# Patient Record
Sex: Male | Born: 1940 | Race: Black or African American | Hispanic: No | Smoking: Former smoker
Health system: Southern US, Community
[De-identification: ages and names within clinical notes are randomized; demographics above are authoritative.]

## PROBLEM LIST (undated history)

## (undated) DIAGNOSIS — K648 Other hemorrhoids: Secondary | ICD-10-CM

## (undated) DIAGNOSIS — F329 Major depressive disorder, single episode, unspecified: Secondary | ICD-10-CM

## (undated) DIAGNOSIS — R600 Localized edema: Secondary | ICD-10-CM

## (undated) DIAGNOSIS — C801 Malignant (primary) neoplasm, unspecified: Secondary | ICD-10-CM

## (undated) DIAGNOSIS — E119 Type 2 diabetes mellitus without complications: Secondary | ICD-10-CM

## (undated) DIAGNOSIS — F32A Depression, unspecified: Secondary | ICD-10-CM

## (undated) DIAGNOSIS — I10 Essential (primary) hypertension: Secondary | ICD-10-CM

## (undated) DIAGNOSIS — Z720 Tobacco use: Secondary | ICD-10-CM

## (undated) DIAGNOSIS — F1010 Alcohol abuse, uncomplicated: Secondary | ICD-10-CM

## (undated) DIAGNOSIS — M1990 Unspecified osteoarthritis, unspecified site: Secondary | ICD-10-CM

## (undated) DIAGNOSIS — Z9049 Acquired absence of other specified parts of digestive tract: Secondary | ICD-10-CM

## (undated) DIAGNOSIS — Z5900 Homelessness unspecified: Secondary | ICD-10-CM

## (undated) HISTORY — DX: Localized edema: R60.0

## (undated) HISTORY — DX: Depression, unspecified: F32.A

## (undated) HISTORY — DX: Essential (primary) hypertension: I10

## (undated) HISTORY — DX: Alcohol abuse, uncomplicated: F10.10

## (undated) HISTORY — DX: Type 2 diabetes mellitus without complications: E11.9

## (undated) HISTORY — DX: Other hemorrhoids: K64.8

## (undated) HISTORY — DX: Tobacco use: Z72.0

## (undated) HISTORY — DX: Unspecified osteoarthritis, unspecified site: M19.90

## (undated) HISTORY — DX: Major depressive disorder, single episode, unspecified: F32.9

## (undated) HISTORY — DX: Malignant (primary) neoplasm, unspecified: C80.1

## (undated) HISTORY — DX: Acquired absence of other specified parts of digestive tract: Z90.49

---
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
## 1968-10-07 HISTORY — PX: THORACOTOMY: SUR1349

## 1979-06-08 HISTORY — PX: OTHER SURGICAL HISTORY: SHX169

## 1999-03-18 ENCOUNTER — Encounter: Payer: Self-pay | Admitting: Emergency Medicine

## 1999-03-18 ENCOUNTER — Inpatient Hospital Stay (HOSPITAL_COMMUNITY): Admission: EM | Admit: 1999-03-18 | Discharge: 1999-03-19 | Payer: Self-pay | Admitting: Emergency Medicine

## 1999-03-20 ENCOUNTER — Encounter: Admission: RE | Admit: 1999-03-20 | Discharge: 1999-03-20 | Payer: Self-pay | Admitting: Internal Medicine

## 1999-05-28 ENCOUNTER — Encounter: Admission: RE | Admit: 1999-05-28 | Discharge: 1999-05-28 | Payer: Self-pay | Admitting: Internal Medicine

## 1999-07-02 ENCOUNTER — Encounter: Admission: RE | Admit: 1999-07-02 | Discharge: 1999-07-02 | Payer: Self-pay | Admitting: Hematology and Oncology

## 1999-07-30 ENCOUNTER — Encounter: Admission: RE | Admit: 1999-07-30 | Discharge: 1999-07-30 | Payer: Self-pay | Admitting: Internal Medicine

## 1999-09-10 ENCOUNTER — Encounter: Admission: RE | Admit: 1999-09-10 | Discharge: 1999-09-10 | Payer: Self-pay | Admitting: Internal Medicine

## 1999-10-08 HISTORY — PX: HEMICOLECTOMY: SHX854

## 1999-10-29 ENCOUNTER — Encounter: Admission: RE | Admit: 1999-10-29 | Discharge: 1999-10-29 | Payer: Self-pay | Admitting: Internal Medicine

## 2000-01-14 ENCOUNTER — Encounter: Admission: RE | Admit: 2000-01-14 | Discharge: 2000-01-14 | Payer: Self-pay | Admitting: Internal Medicine

## 2000-04-07 ENCOUNTER — Ambulatory Visit (HOSPITAL_COMMUNITY): Admission: RE | Admit: 2000-04-07 | Discharge: 2000-04-07 | Payer: Self-pay | Admitting: Internal Medicine

## 2000-04-07 ENCOUNTER — Encounter: Admission: RE | Admit: 2000-04-07 | Discharge: 2000-04-07 | Payer: Self-pay

## 2000-04-07 ENCOUNTER — Ambulatory Visit (HOSPITAL_COMMUNITY): Admission: RE | Admit: 2000-04-07 | Discharge: 2000-04-07 | Payer: Self-pay | Admitting: *Deleted

## 2000-05-12 ENCOUNTER — Encounter: Admission: RE | Admit: 2000-05-12 | Discharge: 2000-05-12 | Payer: Self-pay | Admitting: Internal Medicine

## 2000-05-13 ENCOUNTER — Encounter: Admission: RE | Admit: 2000-05-13 | Discharge: 2000-05-13 | Payer: Self-pay | Admitting: Internal Medicine

## 2000-05-15 ENCOUNTER — Encounter: Admission: RE | Admit: 2000-05-15 | Discharge: 2000-05-15 | Payer: Self-pay | Admitting: Hematology and Oncology

## 2000-06-26 ENCOUNTER — Encounter: Admission: RE | Admit: 2000-06-26 | Discharge: 2000-06-26 | Payer: Self-pay | Admitting: Hematology and Oncology

## 2000-07-11 ENCOUNTER — Encounter (INDEPENDENT_AMBULATORY_CARE_PROVIDER_SITE_OTHER): Payer: Self-pay | Admitting: Specialist

## 2000-07-11 ENCOUNTER — Other Ambulatory Visit: Admission: RE | Admit: 2000-07-11 | Discharge: 2000-07-11 | Payer: Self-pay | Admitting: Gastroenterology

## 2000-07-11 DIAGNOSIS — Z860100 Personal history of colon polyps, unspecified: Secondary | ICD-10-CM | POA: Insufficient documentation

## 2000-08-21 ENCOUNTER — Encounter: Payer: Self-pay | Admitting: Surgery

## 2000-08-22 ENCOUNTER — Encounter: Payer: Self-pay | Admitting: Surgery

## 2000-08-22 ENCOUNTER — Ambulatory Visit (HOSPITAL_COMMUNITY): Admission: RE | Admit: 2000-08-22 | Discharge: 2000-08-22 | Payer: Self-pay | Admitting: Surgery

## 2000-08-27 ENCOUNTER — Encounter (INDEPENDENT_AMBULATORY_CARE_PROVIDER_SITE_OTHER): Payer: Self-pay | Admitting: Specialist

## 2000-08-27 ENCOUNTER — Inpatient Hospital Stay (HOSPITAL_COMMUNITY): Admission: RE | Admit: 2000-08-27 | Discharge: 2000-09-16 | Payer: Self-pay | Admitting: Surgery

## 2000-08-27 DIAGNOSIS — Z9889 Other specified postprocedural states: Secondary | ICD-10-CM

## 2000-08-31 ENCOUNTER — Encounter: Payer: Self-pay | Admitting: General Surgery

## 2000-09-01 ENCOUNTER — Encounter: Payer: Self-pay | Admitting: Surgery

## 2000-09-02 ENCOUNTER — Encounter: Payer: Self-pay | Admitting: Surgery

## 2000-09-03 ENCOUNTER — Encounter: Payer: Self-pay | Admitting: Surgery

## 2000-09-08 ENCOUNTER — Encounter: Payer: Self-pay | Admitting: Family Medicine

## 2000-11-27 ENCOUNTER — Encounter: Admission: RE | Admit: 2000-11-27 | Discharge: 2000-11-27 | Payer: Self-pay | Admitting: Internal Medicine

## 2001-01-16 ENCOUNTER — Ambulatory Visit (HOSPITAL_COMMUNITY): Admission: RE | Admit: 2001-01-16 | Discharge: 2001-01-16 | Payer: Self-pay

## 2001-01-16 ENCOUNTER — Encounter: Admission: RE | Admit: 2001-01-16 | Discharge: 2001-01-16 | Payer: Self-pay

## 2002-04-12 ENCOUNTER — Encounter: Admission: RE | Admit: 2002-04-12 | Discharge: 2002-04-12 | Payer: Self-pay | Admitting: Internal Medicine

## 2002-08-25 ENCOUNTER — Ambulatory Visit (HOSPITAL_COMMUNITY): Admission: RE | Admit: 2002-08-25 | Discharge: 2002-08-25 | Payer: Self-pay | Admitting: Gastroenterology

## 2002-08-25 ENCOUNTER — Encounter (INDEPENDENT_AMBULATORY_CARE_PROVIDER_SITE_OTHER): Payer: Self-pay | Admitting: *Deleted

## 2002-12-20 ENCOUNTER — Encounter: Admission: RE | Admit: 2002-12-20 | Discharge: 2002-12-20 | Payer: Self-pay | Admitting: Internal Medicine

## 2002-12-21 ENCOUNTER — Emergency Department (HOSPITAL_COMMUNITY): Admission: EM | Admit: 2002-12-21 | Discharge: 2002-12-21 | Payer: Self-pay | Admitting: Emergency Medicine

## 2002-12-21 ENCOUNTER — Encounter: Payer: Self-pay | Admitting: Emergency Medicine

## 2003-02-15 ENCOUNTER — Encounter: Admission: RE | Admit: 2003-02-15 | Discharge: 2003-02-15 | Payer: Self-pay | Admitting: Internal Medicine

## 2003-03-21 ENCOUNTER — Encounter: Admission: RE | Admit: 2003-03-21 | Discharge: 2003-03-21 | Payer: Self-pay | Admitting: Internal Medicine

## 2003-05-07 ENCOUNTER — Encounter: Payer: Self-pay | Admitting: Emergency Medicine

## 2003-05-07 ENCOUNTER — Emergency Department (HOSPITAL_COMMUNITY): Admission: EM | Admit: 2003-05-07 | Discharge: 2003-05-07 | Payer: Self-pay | Admitting: Emergency Medicine

## 2003-05-09 ENCOUNTER — Encounter: Admission: RE | Admit: 2003-05-09 | Discharge: 2003-05-09 | Payer: Self-pay | Admitting: Internal Medicine

## 2003-06-23 ENCOUNTER — Encounter: Admission: RE | Admit: 2003-06-23 | Discharge: 2003-06-23 | Payer: Self-pay | Admitting: Internal Medicine

## 2003-07-08 ENCOUNTER — Encounter: Admission: RE | Admit: 2003-07-08 | Discharge: 2003-07-08 | Payer: Self-pay | Admitting: Internal Medicine

## 2003-07-28 ENCOUNTER — Encounter: Admission: RE | Admit: 2003-07-28 | Discharge: 2003-07-28 | Payer: Self-pay | Admitting: Internal Medicine

## 2003-08-19 ENCOUNTER — Encounter: Admission: RE | Admit: 2003-08-19 | Discharge: 2003-08-19 | Payer: Self-pay | Admitting: Internal Medicine

## 2003-08-22 ENCOUNTER — Ambulatory Visit (HOSPITAL_COMMUNITY): Admission: RE | Admit: 2003-08-22 | Discharge: 2003-08-22 | Payer: Self-pay | Admitting: Internal Medicine

## 2003-09-05 ENCOUNTER — Encounter: Admission: RE | Admit: 2003-09-05 | Discharge: 2003-09-05 | Payer: Self-pay | Admitting: Internal Medicine

## 2004-06-18 ENCOUNTER — Ambulatory Visit: Payer: Self-pay | Admitting: Internal Medicine

## 2004-07-12 ENCOUNTER — Ambulatory Visit: Payer: Self-pay | Admitting: Internal Medicine

## 2004-07-12 ENCOUNTER — Ambulatory Visit (HOSPITAL_COMMUNITY): Admission: RE | Admit: 2004-07-12 | Discharge: 2004-07-12 | Payer: Self-pay | Admitting: Internal Medicine

## 2004-07-16 ENCOUNTER — Ambulatory Visit: Payer: Self-pay | Admitting: Internal Medicine

## 2004-07-17 ENCOUNTER — Ambulatory Visit: Payer: Self-pay | Admitting: Internal Medicine

## 2004-07-24 ENCOUNTER — Ambulatory Visit: Payer: Self-pay | Admitting: Internal Medicine

## 2004-07-31 ENCOUNTER — Encounter: Admission: RE | Admit: 2004-07-31 | Discharge: 2004-07-31 | Payer: Self-pay | Admitting: Internal Medicine

## 2004-08-01 ENCOUNTER — Ambulatory Visit: Payer: Self-pay | Admitting: Internal Medicine

## 2004-08-16 ENCOUNTER — Emergency Department (HOSPITAL_COMMUNITY): Admission: EM | Admit: 2004-08-16 | Discharge: 2004-08-17 | Payer: Self-pay | Admitting: Emergency Medicine

## 2004-11-08 ENCOUNTER — Ambulatory Visit: Payer: Self-pay | Admitting: Internal Medicine

## 2004-11-16 ENCOUNTER — Encounter: Admission: RE | Admit: 2004-11-16 | Discharge: 2005-02-14 | Payer: Self-pay | Admitting: Internal Medicine

## 2005-01-25 ENCOUNTER — Inpatient Hospital Stay (HOSPITAL_COMMUNITY): Admission: EM | Admit: 2005-01-25 | Discharge: 2005-01-25 | Payer: Self-pay | Admitting: Emergency Medicine

## 2005-04-05 ENCOUNTER — Emergency Department (HOSPITAL_COMMUNITY): Admission: EM | Admit: 2005-04-05 | Discharge: 2005-04-05 | Payer: Self-pay | Admitting: Emergency Medicine

## 2005-06-27 ENCOUNTER — Ambulatory Visit: Payer: Self-pay | Admitting: Internal Medicine

## 2005-07-26 ENCOUNTER — Ambulatory Visit: Payer: Self-pay | Admitting: Internal Medicine

## 2005-09-27 ENCOUNTER — Emergency Department (HOSPITAL_COMMUNITY): Admission: EM | Admit: 2005-09-27 | Discharge: 2005-09-27 | Payer: Self-pay | Admitting: *Deleted

## 2005-12-23 ENCOUNTER — Ambulatory Visit: Payer: Self-pay | Admitting: Internal Medicine

## 2005-12-30 ENCOUNTER — Ambulatory Visit: Payer: Self-pay | Admitting: Internal Medicine

## 2006-01-27 ENCOUNTER — Ambulatory Visit: Payer: Self-pay | Admitting: Gastroenterology

## 2006-02-13 ENCOUNTER — Ambulatory Visit: Payer: Self-pay | Admitting: Gastroenterology

## 2006-04-22 ENCOUNTER — Ambulatory Visit: Payer: Self-pay | Admitting: Internal Medicine

## 2006-08-18 ENCOUNTER — Ambulatory Visit: Payer: Self-pay | Admitting: Internal Medicine

## 2006-08-18 LAB — CONVERTED CEMR LAB: Hgb A1c MFr Bld: 5.9 %

## 2006-08-20 ENCOUNTER — Encounter (INDEPENDENT_AMBULATORY_CARE_PROVIDER_SITE_OTHER): Payer: Self-pay | Admitting: Internal Medicine

## 2006-08-20 ENCOUNTER — Ambulatory Visit: Payer: Self-pay | Admitting: Hospitalist

## 2006-08-20 LAB — CONVERTED CEMR LAB
ALT: 8 units/L (ref 0–53)
AST: 11 units/L (ref 0–37)
Albumin: 4 g/dL (ref 3.5–5.2)
Alkaline Phosphatase: 83 units/L (ref 39–117)
BUN: 11 mg/dL (ref 6–23)
CO2: 32 meq/L (ref 19–32)
Calcium: 9.7 mg/dL (ref 8.4–10.5)
Chloride: 100 meq/L (ref 96–112)
Cholesterol: 137 mg/dL (ref 0–200)
Creatinine, Ser: 1.12 mg/dL (ref 0.40–1.50)
Glucose, Bld: 101 mg/dL — ABNORMAL HIGH (ref 70–99)
HCT: 42.1 % (ref 41.0–49.0)
HDL: 42 mg/dL (ref >39)
Hemoglobin: 14.6 g/dL (ref 13.9–16.8)
LDL Cholesterol: 68 mg/dL (ref 0–99)
MCHC: 34.7 g/dL (ref 33.1–35.4)
MCV: 87.2 fL (ref 78.8–100.0)
Platelets: 257 10*3/uL (ref 152–374)
Potassium: 4.5 meq/L (ref 3.5–5.3)
RBC: 4.83 M/uL (ref 4.20–5.50)
RDW: 13.3 % (ref 11.5–15.3)
Sodium: 140 meq/L (ref 135–145)
Total Bilirubin: 0.4 mg/dL (ref 0.3–1.2)
Total CHOL/HDL Ratio: 3.3
Total Protein: 7.2 g/dL (ref 6.0–8.3)
Triglycerides: 133 mg/dL (ref <150)
VLDL: 27 mg/dL (ref 0–40)
WBC: 5.2 10*3/uL (ref 3.7–10.0)

## 2006-08-25 ENCOUNTER — Ambulatory Visit: Payer: Self-pay | Admitting: Hospitalist

## 2006-09-01 ENCOUNTER — Encounter (INDEPENDENT_AMBULATORY_CARE_PROVIDER_SITE_OTHER): Payer: Self-pay | Admitting: Internal Medicine

## 2006-09-01 ENCOUNTER — Ambulatory Visit: Payer: Self-pay | Admitting: Hospitalist

## 2006-09-01 LAB — CONVERTED CEMR LAB
BUN: 18 mg/dL (ref 6–23)
CO2: 25 meq/L (ref 19–32)
Calcium: 9.4 mg/dL (ref 8.4–10.5)
Chloride: 100 meq/L (ref 96–112)
Creatinine, Ser: 1.21 mg/dL (ref 0.40–1.50)
Glucose, Bld: 87 mg/dL (ref 70–99)
Potassium: 4.1 meq/L (ref 3.5–5.3)
Sodium: 138 meq/L (ref 135–145)

## 2006-09-08 DIAGNOSIS — E119 Type 2 diabetes mellitus without complications: Secondary | ICD-10-CM

## 2006-09-08 DIAGNOSIS — I10 Essential (primary) hypertension: Secondary | ICD-10-CM | POA: Insufficient documentation

## 2006-09-09 DIAGNOSIS — F17200 Nicotine dependence, unspecified, uncomplicated: Secondary | ICD-10-CM

## 2006-09-09 DIAGNOSIS — F1010 Alcohol abuse, uncomplicated: Secondary | ICD-10-CM | POA: Insufficient documentation

## 2006-09-22 ENCOUNTER — Encounter (INDEPENDENT_AMBULATORY_CARE_PROVIDER_SITE_OTHER): Payer: Self-pay | Admitting: Internal Medicine

## 2006-09-22 ENCOUNTER — Ambulatory Visit: Payer: Self-pay | Admitting: Internal Medicine

## 2006-09-29 ENCOUNTER — Ambulatory Visit: Payer: Self-pay | Admitting: Internal Medicine

## 2006-09-29 ENCOUNTER — Encounter (INDEPENDENT_AMBULATORY_CARE_PROVIDER_SITE_OTHER): Payer: Self-pay | Admitting: Internal Medicine

## 2006-09-29 LAB — CONVERTED CEMR LAB
BUN: 13 mg/dL (ref 6–23)
CO2: 26 meq/L (ref 19–32)
Calcium: 9.6 mg/dL (ref 8.4–10.5)
Chloride: 101 meq/L (ref 96–112)
Creatinine, Ser: 1.18 mg/dL (ref 0.40–1.50)
Glucose, Bld: 88 mg/dL (ref 70–99)
Potassium: 4.9 meq/L (ref 3.5–5.3)
Sodium: 139 meq/L (ref 135–145)

## 2006-10-13 ENCOUNTER — Ambulatory Visit: Payer: Self-pay | Admitting: Hospitalist

## 2006-11-05 ENCOUNTER — Ambulatory Visit: Payer: Self-pay | Admitting: Hospitalist

## 2006-11-05 LAB — CONVERTED CEMR LAB: Blood Glucose, Fingerstick: 102

## 2006-11-27 ENCOUNTER — Ambulatory Visit: Payer: Self-pay | Admitting: Hospitalist

## 2006-11-27 ENCOUNTER — Encounter (INDEPENDENT_AMBULATORY_CARE_PROVIDER_SITE_OTHER): Payer: Self-pay | Admitting: Internal Medicine

## 2006-11-27 LAB — CONVERTED CEMR LAB
BUN: 15 mg/dL (ref 6–23)
CO2: 29 meq/L (ref 19–32)
Calcium: 9.5 mg/dL (ref 8.4–10.5)
Chloride: 100 meq/L (ref 96–112)
Creatinine, Ser: 1.4 mg/dL (ref 0.40–1.50)
Glucose, Bld: 94 mg/dL (ref 70–99)
Potassium: 3.9 meq/L (ref 3.5–5.3)
Sodium: 138 meq/L (ref 135–145)

## 2006-12-01 ENCOUNTER — Ambulatory Visit: Payer: Self-pay | Admitting: Hospitalist

## 2006-12-01 ENCOUNTER — Encounter (INDEPENDENT_AMBULATORY_CARE_PROVIDER_SITE_OTHER): Payer: Self-pay | Admitting: *Deleted

## 2006-12-01 LAB — CONVERTED CEMR LAB
BUN: 15 mg/dL (ref 6–23)
CO2: 31 meq/L (ref 19–32)
Calcium: 9.9 mg/dL (ref 8.4–10.5)
Chloride: 101 meq/L (ref 96–112)
Creatinine, Ser: 1.13 mg/dL (ref 0.40–1.50)
Glucose, Bld: 82 mg/dL
Glucose, Bld: 89 mg/dL (ref 70–99)
Hgb A1c MFr Bld: 5.6 %
Potassium: 4.5 meq/L (ref 3.5–5.3)
Sodium: 139 meq/L (ref 135–145)

## 2006-12-08 ENCOUNTER — Encounter (INDEPENDENT_AMBULATORY_CARE_PROVIDER_SITE_OTHER): Payer: Self-pay | Admitting: Internal Medicine

## 2007-04-02 ENCOUNTER — Encounter (INDEPENDENT_AMBULATORY_CARE_PROVIDER_SITE_OTHER): Payer: Self-pay | Admitting: Internal Medicine

## 2007-04-02 ENCOUNTER — Ambulatory Visit: Payer: Self-pay | Admitting: Internal Medicine

## 2007-04-02 LAB — CONVERTED CEMR LAB
ALT: <8 units/L (ref 0–53)
AST: 11 units/L (ref 0–37)
Albumin: 4.3 g/dL (ref 3.5–5.2)
Alkaline Phosphatase: 76 units/L (ref 39–117)
BUN: 10 mg/dL (ref 6–23)
Blood Glucose, Fingerstick: 96
CO2: 29 meq/L (ref 19–32)
Calcium: 9.9 mg/dL (ref 8.4–10.5)
Chloride: 99 meq/L (ref 96–112)
Cholesterol: 162 mg/dL (ref 0–200)
Creatinine, Ser: 1.09 mg/dL (ref 0.40–1.50)
Glucose, Bld: 96 mg/dL (ref 70–99)
HDL: 47 mg/dL (ref >39)
Hgb A1c MFr Bld: 5.5 %
LDL Cholesterol: 96 mg/dL (ref 0–99)
Potassium: 4.3 meq/L (ref 3.5–5.3)
Sodium: 138 meq/L (ref 135–145)
Total Bilirubin: 0.4 mg/dL (ref 0.3–1.2)
Total CHOL/HDL Ratio: 3.4
Total Protein: 7.4 g/dL (ref 6.0–8.3)
Triglycerides: 93 mg/dL (ref <150)
VLDL: 19 mg/dL (ref 0–40)

## 2007-08-27 ENCOUNTER — Ambulatory Visit: Payer: Self-pay | Admitting: Internal Medicine

## 2007-08-27 ENCOUNTER — Encounter (INDEPENDENT_AMBULATORY_CARE_PROVIDER_SITE_OTHER): Payer: Self-pay | Admitting: Internal Medicine

## 2007-08-27 LAB — CONVERTED CEMR LAB
Creatinine, Urine: 135.3 mg/dL
HCT: 43.7 % (ref 39.0–52.0)
Hemoglobin: 14.5 g/dL (ref 13.0–17.0)
MCHC: 33.2 g/dL (ref 30.0–36.0)
MCV: 89 fL (ref 78.0–100.0)
Microalb Creat Ratio: 3.5 mg/g (ref 0.0–30.0)
Microalb, Ur: 0.48 mg/dL (ref 0.00–1.89)
Platelets: 312 10*3/uL (ref 150–400)
RBC: 4.91 M/uL (ref 4.22–5.81)
RDW: 14.8 % (ref 11.5–15.5)
WBC: 6 10*3/uL (ref 4.0–10.5)

## 2007-09-10 ENCOUNTER — Telehealth (INDEPENDENT_AMBULATORY_CARE_PROVIDER_SITE_OTHER): Payer: Self-pay | Admitting: *Deleted

## 2007-09-10 ENCOUNTER — Encounter (INDEPENDENT_AMBULATORY_CARE_PROVIDER_SITE_OTHER): Payer: Self-pay | Admitting: Internal Medicine

## 2007-09-10 ENCOUNTER — Ambulatory Visit: Payer: Self-pay | Admitting: Internal Medicine

## 2007-09-10 LAB — CONVERTED CEMR LAB
Chloride: 99 meq/L (ref 96–112)
Creatinine, Ser: 1.05 mg/dL (ref 0.40–1.50)
Potassium: 4.8 meq/L (ref 3.5–5.3)
Sodium: 137 meq/L (ref 135–145)

## 2007-12-18 ENCOUNTER — Encounter: Payer: Self-pay | Admitting: Internal Medicine

## 2008-01-07 ENCOUNTER — Encounter (INDEPENDENT_AMBULATORY_CARE_PROVIDER_SITE_OTHER): Payer: Self-pay | Admitting: Internal Medicine

## 2008-01-07 ENCOUNTER — Ambulatory Visit: Payer: Self-pay | Admitting: Internal Medicine

## 2008-01-07 LAB — CONVERTED CEMR LAB
ALT: 8 units/L (ref 0–53)
Albumin: 4.2 g/dL (ref 3.5–5.2)
Blood Glucose, Fingerstick: 81
CO2: 29 meq/L (ref 19–32)
Microalb, Ur: 0.49 mg/dL (ref 0.00–1.89)
Potassium: 3.8 meq/L (ref 3.5–5.3)
Sodium: 139 meq/L (ref 135–145)
Total Bilirubin: 0.3 mg/dL (ref 0.3–1.2)
Total Protein: 7.5 g/dL (ref 6.0–8.3)

## 2008-02-25 ENCOUNTER — Ambulatory Visit: Payer: Self-pay | Admitting: Internal Medicine

## 2008-02-25 DIAGNOSIS — R609 Edema, unspecified: Secondary | ICD-10-CM | POA: Insufficient documentation

## 2008-03-10 ENCOUNTER — Ambulatory Visit: Payer: Self-pay | Admitting: *Deleted

## 2008-03-10 LAB — CONVERTED CEMR LAB: Blood Glucose, Fingerstick: 109

## 2008-04-04 ENCOUNTER — Telehealth (INDEPENDENT_AMBULATORY_CARE_PROVIDER_SITE_OTHER): Payer: Self-pay | Admitting: Internal Medicine

## 2008-06-10 ENCOUNTER — Telehealth (INDEPENDENT_AMBULATORY_CARE_PROVIDER_SITE_OTHER): Payer: Self-pay | Admitting: Internal Medicine

## 2009-04-12 ENCOUNTER — Telehealth: Payer: Self-pay | Admitting: *Deleted

## 2009-04-17 ENCOUNTER — Ambulatory Visit: Payer: Self-pay | Admitting: Infectious Diseases

## 2009-04-17 ENCOUNTER — Encounter: Payer: Self-pay | Admitting: Internal Medicine

## 2009-04-17 DIAGNOSIS — K0262 Dental caries on smooth surface penetrating into dentin: Secondary | ICD-10-CM | POA: Insufficient documentation

## 2009-04-17 LAB — CONVERTED CEMR LAB
HCT: 50.9 % (ref 39.0–52.0)
Hgb A1c MFr Bld: 5.9 %
LDL Cholesterol: 47 mg/dL (ref 0–99)
MCV: 89.6 fL (ref 78.0–100.0)
Potassium: 4.5 meq/L (ref 3.5–5.3)
RBC: 5.68 M/uL (ref 4.22–5.81)
Sodium: 139 meq/L (ref 135–145)
Total CHOL/HDL Ratio: 3.3
VLDL: 42 mg/dL — ABNORMAL HIGH (ref 0–40)
WBC: 6.8 10*3/uL (ref 4.0–10.5)

## 2009-04-26 ENCOUNTER — Ambulatory Visit: Payer: Self-pay | Admitting: Internal Medicine

## 2009-04-26 ENCOUNTER — Encounter: Payer: Self-pay | Admitting: Internal Medicine

## 2009-04-26 ENCOUNTER — Inpatient Hospital Stay (HOSPITAL_COMMUNITY): Admission: EM | Admit: 2009-04-26 | Discharge: 2009-04-28 | Payer: Self-pay | Admitting: Emergency Medicine

## 2009-04-27 ENCOUNTER — Encounter: Payer: Self-pay | Admitting: Internal Medicine

## 2009-04-28 ENCOUNTER — Encounter (INDEPENDENT_AMBULATORY_CARE_PROVIDER_SITE_OTHER): Payer: Self-pay | Admitting: Internal Medicine

## 2009-05-18 ENCOUNTER — Encounter: Payer: Self-pay | Admitting: Internal Medicine

## 2009-05-29 ENCOUNTER — Ambulatory Visit: Payer: Self-pay | Admitting: Internal Medicine

## 2009-06-16 ENCOUNTER — Ambulatory Visit: Payer: Self-pay | Admitting: Infectious Diseases

## 2009-06-16 LAB — CONVERTED CEMR LAB: Blood Glucose, Fingerstick: 101

## 2009-08-01 ENCOUNTER — Encounter: Payer: Self-pay | Admitting: Internal Medicine

## 2009-08-25 ENCOUNTER — Telehealth: Payer: Self-pay | Admitting: Internal Medicine

## 2009-08-28 ENCOUNTER — Telehealth: Payer: Self-pay | Admitting: Internal Medicine

## 2009-09-01 ENCOUNTER — Encounter: Payer: Self-pay | Admitting: Internal Medicine

## 2009-09-11 ENCOUNTER — Ambulatory Visit: Payer: Self-pay | Admitting: Internal Medicine

## 2009-09-11 LAB — CONVERTED CEMR LAB: Hgb A1c MFr Bld: 6.1 %

## 2009-10-24 ENCOUNTER — Encounter: Payer: Self-pay | Admitting: Internal Medicine

## 2010-01-17 ENCOUNTER — Encounter: Payer: Self-pay | Admitting: Internal Medicine

## 2010-02-08 ENCOUNTER — Ambulatory Visit: Payer: Self-pay | Admitting: Internal Medicine

## 2010-02-08 LAB — CONVERTED CEMR LAB
Blood Glucose, Fingerstick: 83
OCCULT 3: NEGATIVE

## 2010-02-08 LAB — FECAL OCCULT BLOOD, GUAIAC: Fecal Occult Blood: NEGATIVE

## 2010-07-05 ENCOUNTER — Ambulatory Visit: Payer: Self-pay | Admitting: Internal Medicine

## 2010-07-05 ENCOUNTER — Ambulatory Visit (HOSPITAL_COMMUNITY): Admission: RE | Admit: 2010-07-05 | Discharge: 2010-07-05 | Payer: Self-pay | Admitting: Internal Medicine

## 2010-07-05 DIAGNOSIS — G609 Hereditary and idiopathic neuropathy, unspecified: Secondary | ICD-10-CM | POA: Insufficient documentation

## 2010-07-05 DIAGNOSIS — M25569 Pain in unspecified knee: Secondary | ICD-10-CM

## 2010-07-05 LAB — CONVERTED CEMR LAB
Blood Glucose, Fingerstick: 136
Hgb A1c MFr Bld: 5.4 %

## 2010-07-24 ENCOUNTER — Encounter: Payer: Self-pay | Admitting: Internal Medicine

## 2010-07-30 ENCOUNTER — Encounter: Payer: Self-pay | Admitting: Internal Medicine

## 2010-09-10 ENCOUNTER — Telehealth: Payer: Self-pay | Admitting: Internal Medicine

## 2010-11-06 NOTE — Miscellaneous (Signed)
Summary: [facility]   Imported By: Florinda Marker 10/24/2009 14:05:18  _____________________________________________________________________  External Attachment:    Type:   Image     Comment:   External Document

## 2010-11-06 NOTE — Progress Notes (Signed)
Summary: Refill/gh  Phone Note Refill Request Message from:  Fax from Pharmacy on September 10, 2010 3:14 PM  Refills Requested: Medication #1:  METFORMIN HCL 500 MG TABS Take 1 tablet by mouth once a day.   Last Refilled: 06/25/2010  Method Requested: Fax to Local Pharmacy Initial call taken by: Angelina Ok RN,  September 10, 2010 3:14 PM  Follow-up for Phone Call       Follow-up by: Blondell Reveal MD,  September 10, 2010 5:50 PM    Prescriptions: METFORMIN HCL 500 MG TABS (METFORMIN HCL) Take 1 tablet by mouth once a day.  #30 x 11   Entered and Authorized by:   Blondell Reveal MD   Signed by:   Blondell Reveal MD on 09/10/2010   Method used:   Faxed to ...       [facility] (retail)       [address]       [place], [place]  [address]       Ph: [phone]       Fax: [phone]   RxID:   [id number]

## 2010-11-06 NOTE — Letter (Signed)
Summary: [facility]   Imported By: Shon Hough 08/01/2010 11:37:31  _____________________________________________________________________  External Attachment:    Type:   Image     Comment:   External Document

## 2010-11-06 NOTE — Assessment & Plan Note (Signed)
Summary: EST-3 MONTH RECHECK/CH   Vital Signs:  Patient profile:   70 year old male Height:      70 inches Weight:      182.0 pounds BMI:     26.21 Temp:     97.2 degrees F oral Pulse rate:   62 / minute BP sitting:   147 / 92  (right arm)  Vitals Entered By: Filomena Jungling NT II (July 05, 2010 10:14 AM) Is Patient Diabetic? Yes Did you bring your meter with you today? No Pain Assessment Patient in pain? yes     Location: BILATERAL LEG Intensity: 9 Type: aching Onset of pain  Chronic Nutritional Status BMI of 25 - 29 = overweight CBG Result 136  Have you ever been in a relationship where you felt threatened, hurt or afraid?No   Does patient need assistance? Functional Status Self care Ambulation Normal   Primary Care Provider:  Blondell Reveal MD   History of Present Illness: 70 year old AAM with PMH as mentioned in the EMR comes to the office for a regular follow up visit.  1. Patient reports that he has been having bilateral knee pains for the last few weeks. He reports that the  pains he hears pops occasionally when he is walking.   2. Patient reports that burning pains for the last few months. Constant pains, aggravated by walking.   He denies any other complaints.   Preventive Screening-Counseling & Management  Alcohol-Tobacco     Alcohol drinks/day: 0     Smoking Status: quit < 6 months     Smoking Cessation Counseling: no     Packs/Day: <0.25     Year Started: AT THE AGE 75'S     Year Quit: 2004  Caffeine-Diet-Exercise     Caffeine use/day: 1can of coca-cola /month     Does Patient Exercise: no  Problems Prior to Update: 1)  Dental Caries Extending Into [place]  (ICD-521.02) 2)  Pedal Edema  (ICD-782.3) 3)  Health Screening  (ICD-V70.0) 4)  Hypertension  (ICD-401.9) 5)  Diabetes Mellitus, Type II  (ICD-250.00) 6)  Tubulovillous Adenoma, Colon, Hx of  (ICD-V12.72) 7)  Colectomy, Partial, With Anastomosis, Hx of  (ICD-V15.2) 8)  Abuse, Alcohol,  Unspecified  (ICD-305.00) 9)  Tobacco Abuse  (ICD-305.1)  Medications Prior to Update: 1)  Lisinopril 40 Mg Tabs (Lisinopril) .... Take 1 Tablet By Mouth Once A Day 2)  Metformin Hcl 500 Mg Tabs (Metformin Hcl) .... Take 1 Tablet By Mouth Once A Day. 3)  Aspir-Low 81 Mg Tbec (Aspirin) .... Take 1 Tablet By Mouth Once A Day With Meals 4)  Hydrochlorothiazide 25 Mg Tabs (Hydrochlorothiazide) .... Take 1 Tablet By Mouth Once A Day 5)  Carvedilol 3.125 Mg Tabs (Carvedilol) .... Take 1 Tablet By Mouth Two Times A Day  Current Medications (verified): 1)  Lisinopril 40 Mg Tabs (Lisinopril) .... Take 1 Tablet By Mouth Once A Day 2)  Metformin Hcl 500 Mg Tabs (Metformin Hcl) .... Take 1 Tablet By Mouth Once A Day. 3)  Aspir-Low 81 Mg Tbec (Aspirin) .... Take 1 Tablet By Mouth Once A Day With Meals 4)  Hydrochlorothiazide 25 Mg Tabs (Hydrochlorothiazide) .... Take 1 Tablet By Mouth Once A Day 5)  Carvedilol 3.125 Mg Tabs (Carvedilol) .... Take 1 Tablet By Mouth Two Times A Day  Allergies (verified): No Known Drug Allergies  Social History: Smoking Status:  quit < 6 months  Review of Systems      See HPI  Physical Exam  General:  alert, well-developed, and well-nourished.   Head:  normocephalic and atraumatic.   Nose:  no external deformity.   Neck:  supple and full ROM.   Lungs:  normal respiratory effort, no intercostal retractions, no accessory muscle use, and normal breath sounds.   Heart:  normal rate, regular rhythm, no murmur, and no gallop.     Impression & Recommendations:  Problem # 1:  HYPERTENSION (ICD-401.9) Repeat manual BP is132/74. Continue current regimen.  His updated medication list for this problem includes:    Lisinopril 40 Mg Tabs (Lisinopril) .Marland Kitchen... Take 1 tablet by mouth once a day    Hydrochlorothiazide 25 Mg Tabs (Hydrochlorothiazide) .Marland Kitchen... Take 1 tablet by mouth once a day    Carvedilol 3.125 Mg Tabs (Carvedilol) .Marland Kitchen... Take 1 tablet by mouth two times a  day  BP today: 147/92 Prior BP: 179/115 (02/08/2010)  Prior 10 Yr Risk Heart Disease: 11 % (04/17/2009)  Labs Reviewed: K+: 4.5 (04/17/2009) Creat: : 1.19 (04/17/2009)   Chol: 127 (04/17/2009)   HDL: 38 (04/17/2009)   LDL: 47 (04/17/2009)   TG: 210 (04/17/2009)  Problem # 2:  DIABETES MELLITUS, TYPE II (ICD-250.00) Greatly controlled. Continue current strength of metformin. His updated medication list for this problem includes:    Lisinopril 40 Mg Tabs (Lisinopril) .Marland Kitchen... Take 1 tablet by mouth once a day    Metformin Hcl 500 Mg Tabs (Metformin hcl) .Marland Kitchen... Take 1 tablet by mouth once a day.    Aspir-low 81 Mg Tbec (Aspirin) .Marland Kitchen... Take 1 tablet by mouth once a day with meals  Orders: T-Hgb A1C (in-house) (19147WG) T- Capillary Blood Glucose (95621)  Labs Reviewed: Creat: 1.19 (04/17/2009)     Last Eye Exam: Mild non-proliferative diabetic retinopathy.  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 Will start Gabapentin. Will refer to Podiatry for annual check.  Orders: Podiatry Referral (Podiatry)  Problem # 4:  KNEE PAIN, BILATERAL (ICD-719.46)  Suspect secondary to OA.Will check X rays of both knees.Will follow up. Recommended him to takeTylenol arthritis.   His updated medication list for this problem includes:    Aspir-low 81 Mg Tbec (Aspirin) .Marland Kitchen... Take 1 tablet by mouth once a day with meals  Orders: T-DG Knee 1-2 Views Bilat (30865)  Complete Medication List: 1)  Lisinopril 40 Mg Tabs (Lisinopril) .... Take 1 tablet by mouth once a day 2)  Metformin Hcl 500 Mg Tabs (Metformin hcl) .... Take 1 tablet by mouth once a day. 3)  Aspir-low 81 Mg Tbec (Aspirin) .... Take 1 tablet by mouth once a day with meals 4)  Hydrochlorothiazide 25 Mg Tabs (Hydrochlorothiazide) .... Take 1 tablet by mouth once a day 5)  Carvedilol 3.125 Mg Tabs (Carvedilol) ....  Take 1 tablet by mouth two times a day 6)  Gabapentin 300 Mg Caps (Gabapentin) .... Take 1 tablet by mouth three times a day  Patient Instructions: 1)  Please schedule a follow-up appointment in 3 months. 2)  Take all the medications as advised below. Prescriptions: GABAPENTIN 300 MG CAPS (GABAPENTIN) Take 1 tablet by mouth three times a day  #90 x 5   Entered and Authorized by:   Blondell Reveal MD   Signed by:   Blondell Reveal MD on 07/05/2010   Method used:   Faxed to ...       [facility] (retail)       [id number]       [place], [place]  [address]       Ph: [phone]       Fax: [phone]   RxID:   [id number]   Prevention & Chronic Care Immunizations   Influenza vaccine: Fluvax Non-MCR  (08/27/2007)   Influenza vaccine deferral: Refused  (09/11/2009)    Tetanus booster: 04/17/2009: Tdap   Tetanus booster due: 04/18/2019    Pneumococcal vaccine: Pneumovax  (04/17/2009)   Pneumococcal vaccine due: 04/17/2014    H. zoster vaccine: Not documented  Colorectal Screening   Hemoccult: Not documented   Hemoccult action/deferral: Deferred  (09/11/2009)    Colonoscopy: Normal  (02/13/2006)  Other Screening   PSA: Not documented   Smoking status: quit < 6 months  (07/05/2010)  Diabetes Mellitus   HgbA1C: 5.4  (07/05/2010)   HgbA1C action/deferral: Ordered  (07/05/2010)    Eye exam: Mild non-proliferative diabetic retinopathy.  OU   (05/18/2009)   Eye exam due: 05/2010    Foot exam: yes  (04/17/2009)   Foot exam action/deferral: Do today   High risk foot: Yes  (04/17/2009)   Foot care education: Done  (04/17/2009)   Foot exam due: 04/17/2010    Urine microalbumin/creatinine ratio: 9.3  (09/11/2009)   Urine microalbumin action/deferral: Ordered  Lipids   Total Cholesterol: 127  (04/17/2009)   LDL: 47  (04/17/2009)   LDL Direct: Not documented   HDL: 38  (04/17/2009)   Triglycerides: 210  (04/17/2009)  Hypertension    Last Blood Pressure: 147 / 92  (07/05/2010)   Serum creatinine: 1.19  (04/17/2009)   Serum potassium 4.5  (04/17/2009)  Self-Management Support :   Personal Goals (by the next clinic visit) :     Personal A1C goal: 7  (09/11/2009)     Personal blood pressure goal: 130/80  (09/11/2009)     Personal LDL goal: 100  (09/11/2009)    Patient will work on the following items until the next clinic visit to reach self-care goals:     Medications and monitoring: take my medicines every day, check my blood sugar  (07/05/2010)     Eating: drink diet soda or water instead of juice or soda, eat more vegetables, use fresh or frozen vegetables, eat foods that are low in salt, eat baked foods instead of fried foods, eat fruit for snacks and desserts  (07/05/2010)     Activity: take a 30 minute walk every day  (02/08/2010)     Other: walks around house  (09/11/2009)    Diabetes self-management support: Education handout, Resources for patients handout  (07/05/2010)   Diabetes education handout printed    Hypertension self-management support: Education handout, Resources for patients handout  (07/05/2010)   Hypertension education handout printed      Resource handout printed.   Nursing Instructions: HgbA1C today (see order)      Laboratory Results   Blood Tests   Date/Time Received: July 05, 2010 11:01 AM  Date/Time Reported: Burke Keels  July 05, 2010 11:01 AM   HGBA1C: 5.4%   (Normal Range: Non-Diabetic - 3-6%   Control Diabetic - 6-8%) CBG Random:: 136mg /dL

## 2010-11-06 NOTE — Consult Note (Signed)
Summary: [facility]   Imported By: Louretta Parma 07/31/2010 16:48:23  _____________________________________________________________________  External Attachment:    Type:   Image     Comment:   External Document

## 2010-11-06 NOTE — Letter (Signed)
[facility]   Imported By: Margie Billet 01/18/2010 11:46:28  _____________________________________________________________________  External Attachment:    Type:   Image     Comment:   External Document
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
## 2010-11-30 ENCOUNTER — Encounter: Payer: Self-pay | Admitting: Ophthalmology

## 2010-11-30 ENCOUNTER — Ambulatory Visit: Payer: Self-pay | Admitting: Internal Medicine

## 2010-11-30 ENCOUNTER — Ambulatory Visit (INDEPENDENT_AMBULATORY_CARE_PROVIDER_SITE_OTHER): Payer: Medicare Other | Admitting: Ophthalmology

## 2010-11-30 DIAGNOSIS — R32 Unspecified urinary incontinence: Secondary | ICD-10-CM

## 2010-11-30 DIAGNOSIS — I10 Essential (primary) hypertension: Secondary | ICD-10-CM

## 2010-11-30 DIAGNOSIS — D369 Benign neoplasm, unspecified site: Secondary | ICD-10-CM | POA: Insufficient documentation

## 2010-11-30 DIAGNOSIS — Z23 Encounter for immunization: Secondary | ICD-10-CM

## 2010-11-30 DIAGNOSIS — Z0000 Encounter for general adult medical examination without abnormal findings: Secondary | ICD-10-CM

## 2010-11-30 DIAGNOSIS — N400 Enlarged prostate without lower urinary tract symptoms: Secondary | ICD-10-CM

## 2010-11-30 DIAGNOSIS — E119 Type 2 diabetes mellitus without complications: Secondary | ICD-10-CM

## 2010-11-30 DIAGNOSIS — Z86010 Personal history of colonic polyps: Secondary | ICD-10-CM

## 2010-11-30 DIAGNOSIS — M25569 Pain in unspecified knee: Secondary | ICD-10-CM

## 2010-11-30 LAB — BASIC METABOLIC PANEL
BUN: 12 mg/dL (ref 6–23)
CO2: 28 mEq/L (ref 19–32)
Chloride: 98 mEq/L (ref 96–112)
Creat: 1.04 mg/dL (ref 0.40–1.50)
Glucose, Bld: 153 mg/dL — ABNORMAL HIGH (ref 70–99)
Potassium: 3.6 mEq/L (ref 3.5–5.3)

## 2010-11-30 MED ORDER — GABAPENTIN 300 MG PO CAPS: 300.0000 mg | ORAL_CAPSULE | Freq: Three times a day (TID) | ORAL | Status: DC

## 2010-11-30 MED ORDER — CARVEDILOL 3.125 MG PO TABS: 3.1250 mg | ORAL_TABLET | Freq: Two times a day (BID) | ORAL | Status: DC

## 2010-11-30 MED ORDER — LISINOPRIL 40 MG PO TABS: 40.0000 mg | ORAL_TABLET | Freq: Every day | ORAL | Status: DC

## 2010-11-30 MED ORDER — FINASTERIDE 5 MG PO TABS: 5.0000 mg | ORAL_TABLET | Freq: Every day | ORAL | Status: AC

## 2010-11-30 MED ORDER — METFORMIN HCL 500 MG PO TABS: 500.0000 mg | ORAL_TABLET | Freq: Every day | ORAL | Status: DC

## 2010-11-30 MED ORDER — CARVEDILOL 6.25 MG PO TABS: 6.2500 mg | ORAL_TABLET | Freq: Two times a day (BID) | ORAL | Status: DC

## 2010-11-30 MED ORDER — HYDROCHLOROTHIAZIDE 25 MG PO TABS: 25.0000 mg | ORAL_TABLET | Freq: Every day | ORAL | Status: DC

## 2010-11-30 MED ORDER — ASPIRIN 81 MG PO TBEC: 81.0000 mg | DELAYED_RELEASE_TABLET | Freq: Every day | ORAL | Status: DC

## 2010-11-30 NOTE — Assessment & Plan Note (Signed)
The patient will receive a flu shot today.

## 2010-11-30 NOTE — Progress Notes (Signed)
Subjective:    Patient ID: Caleb Andrews, male    DOB: 07/22/1941, 70 y.o.   MRN: [id number]  HPI This is a 70 year old male with a past medical history significant for Type II DM and HTN who presents for routine check up.  The patient was last seen by Dr. Comer Locket and at that time had burning pains in his legs as well as knee pain.  This was felt to be consistent with his peripheral neuropathy and neuronin was started.  Bilateral knee x-rays demonstrated mild to moderate osteoarthritis of the right knee with old MCL tear of the left. Pt states that his knee pains are improving. and currently denies significant problems with his peripheral neuropathy.   Pt also presents with a 3-4 month hx of incontinance.  The patient endorses increased urgency but denies any burning when he urinates, back pain, hematuria. The episodes occur about once weekly.  No bowl incontinence.  Pt does feel that he has an occasional weak stream but denies any difficulty with initation.    With regards to pts DM pt has not been checking his CBG's, but  Pt denies any hypoglycemia and wishes to be refered to Lupita Leash for assistance with his glucometer use.  Review of Systems  Constitutional: Negative for fever and chills.  Respiratory: Negative for cough and shortness of breath.   Cardiovascular: Negative for chest pain and palpitations.  Gastrointestinal: Negative for vomiting, diarrhea and constipation.  Genitourinary: Negative for dysuria, discharge and enuresis.       Objective:   Physical Exam  Constitutional: He appears well-developed and well-nourished.  HENT:  Head: Normocephalic and atraumatic.  Eyes: Pupils are equal, round, and reactive to light.  Cardiovascular: Normal rate, regular rhythm and intact distal pulses.  Exam reveals no gallop and no friction rub.   No murmur heard. Pulmonary/Chest: Effort normal and breath sounds normal. He has no wheezes. He has no rales.  Abdominal: Soft. Bowel sounds  are normal. He exhibits no distension. There is no tenderness.  Genitourinary:       There is a mildly enlarge prostate gland without any nodularity.  There is normal rectal tone.   Musculoskeletal: Normal range of motion.  Neurological: He is alert. No cranial nerve deficit.  Skin: No rash noted.        Current outpatient prescriptions:aspirin 81 MG EC tablet, Take 1 tablet (81 mg total) by mouth daily., Disp: 30 tablet, Rfl: 11;  carvedilol (COREG) 6.25 MG tablet, Take 1 tablet (6.25 mg total) by mouth 2 (two) times daily with a meal., Disp: 60 tablet, Rfl: 11;  finasteride (PROSCAR) 5 MG tablet, Take 1 tablet (5 mg total) by mouth daily., Disp: 30 tablet, Rfl: 11 gabapentin (NEURONTIN) 300 MG capsule, Take 1 capsule (300 mg total) by mouth 3 (three) times daily., Disp: 90 capsule, Rfl: 11;  hydrochlorothiazide 25 MG tablet, Take 1 tablet (25 mg total) by mouth daily., Disp: 30 tablet, Rfl: 11;  lisinopril (PRINIVIL,ZESTRIL) 40 MG tablet, Take 1 tablet (40 mg total) by mouth daily., Disp: 30 tablet, Rfl: 11 metFORMIN (GLUCOPHAGE) 500 MG tablet, Take 1 tablet (500 mg total) by mouth daily with breakfast., Disp: 30 tablet, Rfl: 11;  DISCONTD: aspirin 81 MG EC tablet, Take 81 mg by mouth daily.  , Disp: , Rfl: ;  DISCONTD: carvedilol (COREG) 3.125 MG tablet, Take 6.25 mg by mouth 2 (two) times daily with a meal. , Disp: , Rfl:  DISCONTD: carvedilol (COREG) 3.125 MG tablet, Take 1  tablet (3.125 mg total) by mouth 2 (two) times daily with a meal., Disp: 60 tablet, Rfl: 11;  DISCONTD: gabapentin (NEURONTIN) 300 MG capsule, Take 300 mg by mouth 3 (three) times daily.  , Disp: , Rfl: ;  DISCONTD: hydrochlorothiazide 25 MG tablet, Take 25 mg by mouth daily.  , Disp: , Rfl: ;  DISCONTD: lisinopril (PRINIVIL,ZESTRIL) 40 MG tablet, Take 40 mg by mouth daily.  , Disp: , Rfl:  DISCONTD: metFORMIN (GLUCOPHAGE) 500 MG tablet, Take 500 mg by mouth daily with breakfast.  , Disp: , Rfl:   Past Medical History    Diagnosis Date  . Type II diabetes mellitus   . Hypertension       Assessment & Plan:

## 2010-11-30 NOTE — Assessment & Plan Note (Signed)
Will start fenasteride today, if bp continues to be elevated consider changing to an alpha 1 inhibitor.

## 2010-11-30 NOTE — Patient Instructions (Signed)
Please return in 2-3 weeks for a repeat blood pressure check.  If you develop any new symptoms in the mean time, please call.  We will schedule you for both a colonoscopy as well as a eye exam.

## 2010-11-30 NOTE — Assessment & Plan Note (Addendum)
This is likely secondary to BPH though the DDX would also include UTI, spinal cord injury (very unlikely). Will check a UA and culture today as well as get a BMET to check his renal function.  Will treat at this point in time with finasteride.  If pt continues to have symptoms consider a post void residual to rule out overflow incontinence.

## 2010-11-30 NOTE — Assessment & Plan Note (Signed)
This is out of control and the patient states that he is taking his medications regularly.   I will increase the pts coreg today and follow up in 2-3 weeks for a BP check.

## 2010-11-30 NOTE — Assessment & Plan Note (Addendum)
At this point, the patients diabetes is under good control and he has had an A1C <7.  I will make no adjustments in the patients medications today.   I did  Encourage the patient to continue regularly checking their post prandial CBG's, as well as to continue checking their feet for lesions daily.  I also recommended that the patient continue to watch their diet and avoid high glycemic index foods.  The patient denies any barriers to these recommendations.  The pt does wish to meet with Ahmed Prima for DM education. Will also refer for ophthalmology examination.   Lab Results  Component Value Date   HGBA1C 6.3 11/30/2010   HGBA1C 5.4 07/05/2010   HGBA1C 6.0 02/08/2010   Lab Results  Component Value Date   MICROALBUR 0.50 09/11/2009   CREATININE 1.19 04/17/2009

## 2010-11-30 NOTE — Assessment & Plan Note (Signed)
This has improved and only occasionally bothers the patient.  Will continue to monitor at this time.

## 2010-11-30 NOTE — Assessment & Plan Note (Signed)
I will refer the patient for repeat colonoscopy today. It appears that the pt has not had one since 2003.

## 2010-12-01 LAB — URINALYSIS, ROUTINE W REFLEX MICROSCOPIC
Bilirubin Urine: NEGATIVE
Hgb urine dipstick: NEGATIVE
Ketones, ur: NEGATIVE mg/dL
Specific Gravity, Urine: 1.016 (ref 1.005–1.030)
Urine Glucose, Fasting: NEGATIVE mg/dL
pH: 6 (ref 5.0–8.0)

## 2010-12-04 ENCOUNTER — Other Ambulatory Visit: Payer: Self-pay | Admitting: *Deleted

## 2010-12-04 DIAGNOSIS — I10 Essential (primary) hypertension: Secondary | ICD-10-CM

## 2010-12-06 MED ORDER — HYDROCHLOROTHIAZIDE 25 MG PO TABS: 25.0000 mg | ORAL_TABLET | Freq: Every day | ORAL | Status: DC

## 2010-12-06 MED ORDER — LISINOPRIL 40 MG PO TABS: 40.0000 mg | ORAL_TABLET | Freq: Every day | ORAL | Status: DC

## 2010-12-06 MED ORDER — GABAPENTIN 300 MG PO CAPS: 300.0000 mg | ORAL_CAPSULE | Freq: Three times a day (TID) | ORAL | Status: DC

## 2010-12-17 ENCOUNTER — Encounter: Payer: Self-pay | Admitting: Ophthalmology

## 2010-12-20 ENCOUNTER — Ambulatory Visit: Payer: Medicare Other | Admitting: Dietician

## 2010-12-20 ENCOUNTER — Ambulatory Visit: Payer: Medicare Other | Admitting: Internal Medicine

## 2010-12-20 LAB — GLUCOSE, CAPILLARY: Glucose-Capillary: 136 mg/dL — ABNORMAL HIGH (ref 70–99)

## 2011-01-08 LAB — GLUCOSE, CAPILLARY: Glucose-Capillary: 125 mg/dL — ABNORMAL HIGH (ref 70–99)

## 2011-01-11 LAB — GLUCOSE, CAPILLARY: Glucose-Capillary: 101 mg/dL — ABNORMAL HIGH (ref 70–99)

## 2011-01-13 LAB — GLUCOSE, CAPILLARY
Glucose-Capillary: 182 mg/dL — ABNORMAL HIGH (ref 70–99)
Glucose-Capillary: 69 mg/dL — ABNORMAL LOW (ref 70–99)
Glucose-Capillary: 88 mg/dL (ref 70–99)
Glucose-Capillary: 95 mg/dL (ref 70–99)
Glucose-Capillary: 96 mg/dL (ref 70–99)
Glucose-Capillary: 81 mg/dL (ref 70–99)

## 2011-01-13 LAB — PROTIME-INR: INR: 1 (ref 0.00–1.49)

## 2011-01-13 LAB — CBC
HCT: 45.9 % (ref 39.0–52.0)
MCHC: 34.3 g/dL (ref 30.0–36.0)
Platelets: 208 10*3/uL (ref 150–400)
RDW: 13.5 % (ref 11.5–15.5)

## 2011-01-13 LAB — CARDIAC PANEL(CRET KIN+CKTOT+MB+TROPI)
Relative Index: 0.8 (ref 0.0–2.5)
Troponin I: 0.02 ng/mL (ref 0.00–0.06)

## 2011-01-13 LAB — MAGNESIUM: Magnesium: 1.9 mg/dL (ref 1.5–2.5)

## 2011-01-13 LAB — BASIC METABOLIC PANEL
BUN: 12 mg/dL (ref 6–23)
GFR calc non Af Amer: 57 mL/min — ABNORMAL LOW (ref >60)
Glucose, Bld: 125 mg/dL — ABNORMAL HIGH (ref 70–99)
Potassium: 4 mEq/L (ref 3.5–5.1)

## 2011-01-13 LAB — HIV ANTIBODY (ROUTINE TESTING W REFLEX): HIV: NONREACTIVE

## 2011-01-13 LAB — URINALYSIS, ROUTINE W REFLEX MICROSCOPIC
Nitrite: NEGATIVE
Specific Gravity, Urine: 1.015 (ref 1.005–1.030)
Urobilinogen, UA: 0.2 mg/dL (ref 0.0–1.0)

## 2011-01-13 LAB — POCT CARDIAC MARKERS: CKMB, poc: <1 ng/mL — ABNORMAL LOW (ref 1.0–8.0)

## 2011-01-13 LAB — TROPONIN I: Troponin I: 0.02 ng/mL (ref 0.00–0.06)

## 2011-01-13 LAB — RAPID URINE DRUG SCREEN, HOSP PERFORMED
Barbiturates: NOT DETECTED
Opiates: NOT DETECTED

## 2011-01-13 LAB — TSH: TSH: 1.739 u[IU]/mL (ref 0.350–4.500)

## 2011-01-13 LAB — CK TOTAL AND CKMB (NOT AT ARMC): Relative Index: 1.6 (ref 0.0–2.5)

## 2011-01-13 LAB — DIFFERENTIAL: Lymphocytes Relative: 13 % (ref 12–46)

## 2011-01-13 LAB — APTT: aPTT: 25 seconds (ref 24–37)

## 2011-01-23 ENCOUNTER — Encounter: Payer: Self-pay | Admitting: *Deleted

## 2011-01-23 ENCOUNTER — Telehealth: Payer: Self-pay | Admitting: *Deleted

## 2011-01-23 NOTE — Telephone Encounter (Signed)
Pt came in today for Ophthal. And GI referral appts ; which were already scheduled.  I re-scheduled appts and sent tp pt via mail.

## 2011-02-15 ENCOUNTER — Other Ambulatory Visit: Payer: Medicare Other | Admitting: Gastroenterology

## 2011-02-19 ENCOUNTER — Encounter: Payer: Self-pay | Admitting: Dietician

## 2011-02-19 ENCOUNTER — Ambulatory Visit (INDEPENDENT_AMBULATORY_CARE_PROVIDER_SITE_OTHER): Payer: Medicare Other | Admitting: Dietician

## 2011-02-19 ENCOUNTER — Encounter: Payer: Medicare Other | Admitting: Internal Medicine

## 2011-02-19 DIAGNOSIS — E119 Type 2 diabetes mellitus without complications: Secondary | ICD-10-CM

## 2011-02-19 LAB — GLUCOSE, CAPILLARY: Glucose-Capillary: 133 mg/dL — ABNORMAL HIGH (ref 70–99)

## 2011-02-19 NOTE — Patient Instructions (Signed)
Please schedule a visit to learn how to use new meter same day as next doctor appointtnment.  Should be in the morning so you can have fasting cholesterol done.
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
## 2011-02-19 NOTE — Progress Notes (Signed)
Medical Nutrition Therapy:  Appt start time 1500 end time:  1530.  Assessment:  Primary concerns today: Blood sugar control awaternd help at home, nutritional status assessment. Usual eating pattern includes Meal 3 and 3+ snacks per day.  Bedtime 8-9 Pm and  Arises 4 am- watches TV, [place] bacon eggs x 2, cheese, toast x 2 with margarine, milk,  Lunch- stew from can with additions- corn, black eyed peas, onions and crackers. dinner about 5 PM stew again, fruit for snacks every other day, small amounts of juice. Potato chips for bedtime snack,  cake.  Avoided foods include: regular soda   Usual physical activity includes walking 60 minutes a day 7 days per week, gardening.   Diagnosis and Intervention:  Progress Towards Goal(s):  In progress   Nutritional Diagnosis:  NB-1.4 Self-monitoring deficit As related to has a machine to check sugars but needs to learn to use it.  As evidenced by patient report.  Interventions: 1- Education about self monitoring his diabetes. 2- Education and counseling about chronic complications for diabetes and self monitoring of same. 3- Coordination of care with physician regarding request order for fasting lipid check at next visit and A1C done today at her request    Monitoring/Evaluation:  Dietary intake in 4 week(s)

## 2011-02-22 NOTE — Discharge Summary (Signed)
[place]  Patient:    Caleb Andrews              MRN: [id number] Adm. Date:  [id number] Disc. Date: [id number] Attending:  Andre Lefort CC:         Medicine Teaching Service [facility] T. Pleas Koch., M.D. [facility]   Discharge Summary  DATE OF BIRTH:  1941/02/22  DIAGNOSES: 1. 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 2. Postoperative small-bowel obstruction. 3. Hypertension. 4. Malnutrition - placed on total nutrient admixture. 5. Right foot pain - etiology unknown.  OPERATIONS PERFORMED:  Patient had a laparoscopically assisted right hemicolectomy on August 27, 2000 and then second operation he had an abdominal exploration with inner lysis of adhesions and left subclavian triple-lumen catheter on September 03, 2000.  HISTORY OF PRESENT ILLNESS:  Caleb Andrews is a 70 year old black male who is followed through the medicine teaching service was found to have guaiac-positive stools.  He had a colonoscopy by Dr. Russella Dar on July 11, 2000 which showed a 3 cm colonic polyp which by biopsy showed a tubulovillous adenoma.  It was felt this polyp was probably not amenable to colonoscopic resection and he saw me for resecting this.  PAST MEDICAL HISTORY:  He has no known allergies.  MEDICATIONS:  He is on Maxzide one tablet daily.  REVIEW OF SYSTEMS:  Noncontributory.  PHYSICAL EXAMINATION:  His weight was 190 pounds.  He was a well-nourished black male who had a benign abdomen with no palpable mass.  HOSPITAL COURSE:  He underwent a mechanical antibiotic prep at home and presented to the operating room on August 27, 2000 where he underwent a laparoscopic-assisted right hemicolectomy.  Postoperatively, he did well for the first 48 hours but then he became distended by about the third postoperative day though he was having small bowel movements.  By the fifth postoperative day, his  white blood count was 7800, his potassium 4.3, his abdomen remained distended and quiet and he had an NG tube which was placed back in.  By the seventh postoperative day, he still was having very small amounts of flatus but had remained significantly distended, his abdominal wound looked okay, he was afebrile and a KUB was consistent with a probable small-bowel obstruction.  The etiology of this bowel obstruction was unclear.  Discussion carried out with the patient.  He was taken back to the operating room on August 26, 2000 where he underwent abdominal exploration.  He was found to have a single area of the small bowel which was bruised and adhered to itself which appeared to have caused the bowel obstruction.  His anastomosis was patent to at least 3 cm.  A left subclavian triple-lumen catheter was placed for postoperative PEN and retention sutures were placed.  He continued a slow postoperative recovery and abdomen remained distended, he was placed on TNA for his nutrition, and he had some periods of hypokalemia which were treated.  By the fifth postoperative day, he still was with an ileus, his hemoglobin was 9.7, white blood count of 6700, his albumin 7.1.  He also was complaining of some right foot pain whose etiology we never totally cleared.  The eighth postoperative day, he had three bowel movements, his abdomen got softer, his NG tube was removed, and he was slowly advanced to a regular diet. I was slow to take him off the TPN to make sure he had adequate nutrition.  By  September 16, 2000, he was tolerating his diet fairly well, his white blood count was 9200, his albumin was 2.6, he still was modestly distended but it seemed like he had turned the corner.  His sutures and staples were removed and he was ready for discharge.  His final pathology revealed a tubulovillous adenoma of the right colon with no evidence of malignancy.  DISCHARGE INSTRUCTIONS:  Regular diet.  No  heavy lifting or driving for about one week.  We will see him back in about one week to 10 days.  He was given Vicodin for pain.  He is to call for interval problems or complaints. DD: 11/06/00 TD:  11/08/00 Job: [id number]
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
## 2011-02-22 NOTE — Op Note (Signed)
[place]  Patient:    Caleb Andrews, Caleb Andrews              MRN: [id number] Proc. Date: 08/27/00 Adm. Date:  [id number] Attending:  Andre Lefort CC:         [facility]  Venita Lick. Pleas Koch., M.D. 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  POSTOPERATIVE DIAGNOSIS:  A 3.5 cm villus tumor right colon.  PROCEDURE:  Laparoscopically-assisted right hemicolectomy.  SURGEON:  Sandria Bales. Ezzard Standing, M.D.  ASSISTANT:  Hyman Hopes. Carolynne Edouard, M.D.  ANESTHESIA:  General endotracheal.  BLOOD LOSS:  100 cc.  DRAINS:  None.  INDICATIONS:  Mr. Haile is a 70 year old black gentleman, who has been found to have a tumor of his right colon biopsied by Dr. Claudette Head.  The tumor proved to have villus components and it was not felt to be amenable to colonoscopic resection.  Patient now comes for laparoscopically-assisted right hemicolectomy.  OPERATIVE NOTE:  Patient placed in the supine position.  He had completed a mechanical antibiotic bowel prep at home and was given 2 g of cefotetan at the initiation of the procedure.  He had a Foley catheter in place, an oral gastric tube in place, PS stockings in place.  His abdomen was prepped with Betadine solution and sterilely draped with his arms tucked to his sides.  An infraumbilical incision was made and a 12 mm Hasson trocar was inserted and secured with a 0 Vicryl suture.  Abdominal exploration using a 30 degree laparoscope was then carried out.  The right and left lobes of the liver were unremarkable, the gallbladder anteriorly was unremarkable, the stomach was unremarkable.  Patient had a fair amount of omentum, but had no mass, nodularity or other suspicious lesion within the abdomen.  We then placed three additional trocars, all 10 mm, one in the right upper quadrant, one in the left upper quadrant and one in  the left lower quadrant. I then started with dissection around the cecum mobilizing the right colon, identified the appendix, went along the lateral wall of the abdominal cavity at the line of Toldt and reflected the right colon medially.  I then took down the hepatic flexure, identified the duodenum, which was well posterior to our dissection and got down to the transverse colon, which I was able to mobilize down to the umbilicus.  At this time, I felt I had mobilized the colon well enough.  I then removed by umbilicus trocar and made about a 6 cm incision around the umbilicus and delivered the right colon into the wound.  I was able to palpate about a 3 to 3.5 cm tumor of his mid right colon.  I divided the mesentery using Kelly clamps and 2-0 silk ties.  I then did a stapled side-to-side anastomosis between the terminal ileum and the transverse colon using the GIA-70 stapler.  Then used the TA-50 stapler across this leaving a good 2.5-3 cm hole between the small bowel and the colon.  I closed the mesentery with interrupted 2-0 silk sutures, brought the ______ of the bowel and covered the bowel.  I chose buttressing sutures to support the stapled anastomosis, but the stapled anastomosis ______ was in good shape.  I then returned the bowel back to the abdominal cavity, closed the abdominal wound with interrupted #1 Novofil sutures.  I then reinserted my trocars, examined  where the bowel was sewn together and there was no evidence of bleeding.  I looked in the right colonic gutter, there was no evidence of bleeding.  I then removed my trocars under direct visualization.  Each trocar site was closed with a 5-0 Vicryl suture.  The midline wound was stapled, each wound was steri-stripped and painted with tincture of benzoin, and sterilely dressed with 4 x 4s and Hypafix.  The patient tolerated the procedure well, was transported to the recovery room in good condition.  Sponge and needle  corrected at end of the count patient tolerated the procedure well.  The final sponge and needle count were correct at the end of the case. DD:  08/27/00 TD:  08/27/00 Job: [id number]

## 2011-02-22 NOTE — Discharge Summary (Signed)
Caleb Andrews, Caleb NO.:  [id number]   MEDICAL RECORD NO.:  [id number]          PATIENT TYPE:  INP   LOCATION:  4733                         FACILITY:  [facility]   PHYSICIAN:  Alvester Morin, M.D.  DATE OF BIRTH:  06-11-1941   DATE OF ADMISSION:  04/26/2009  DATE OF DISCHARGE:  04/28/2009                               DISCHARGE SUMMARY   DISCHARGE DIAGNOSES:  1. Syncope.  2. Diabetes mellitus.  3. Hypertension.   DISCHARGE MEDICATIONS:  1. Lisinopril 40 mg once a day.  2. Metformin 500 mg once a day  3. Aspirin 81 mg once a day.   Of note, changes to his medications included the lowering of the dose of  his metformin from 1000 mg twice a day to 500 mg once a day as well as  removing hydrochlorothiazide from his hypertensive regimen, and only  leaving lisinopril 40 mg once daily in its place.   DISPOSITION AND FOLLOWUP:  The patient has a followup appointment in the  [facility] on August 23, at 2:50 in the afternoon with  Dr. Comer Locket.  Things to be checked at that appointment include his blood  pressure given the removal of his HCTZ from his hypertensive regimen.  Also, please check capillary blood glucose to evaluate his blood sugar  given his decrease in his metformin dose.  Previously, his A1c had  always run between 5.5 and 6.1, I believe over the past several years on  1000 mg twice daily dose.  For this reason, we felt that his metformin  dose could  be decreased and possibly avoid any episodes of  hypoglycemia, although none were noted in his chart.  Also, the patient  will have a follow up with Dr. Garen Lah with [facility].  He will be called for an appointment and at that time,  Cardiology had recommended the possibility of a CardioNet for 2 weeks as  an outpatient and also a possibility of a stress test, and those things  to be evaluated by Dr. Garen Lah of Cardiology.   PROCEDURES PERFORMED:  The  patient had a CT of his head without  contrast, which showed no acute intracranial abnormalities.  The patient  also had a 2-D echo, whose findings and conclusions included the  following left ventricle cavity size normal, wall thickness normal,  systolic function normal.  Estimated ejection fraction in the range of  55-60%.  Regional wall motion abnormalities could not be excluded.  Doppler parameters are consistent with abnormal left ventricular  relaxation (grade 1 diastolic dysfunction).   CONSULTATIONS:  Cardiology, Sheliah Mends, MD   ADMITTING HISTORY AND PHYSICAL:  Caleb Andrews is a 70 year old male with  past medical history significant for diabetes mellitus type 2 and  hypertension, who presents to the ED following an episode of falling  out at church.  He states at approximately 2:15, he was standing in  church, began to feel hot and lightheaded and subsequently blacked out  for an unknown duration.  He denies any dizziness, chest pain, shortness  of breath, palpitations, nausea, vomiting, or diarrhea.  He denies  headache, any new medications, and also reports taking his prescriptions  as directed.  He reports 1 prior similar episode of syncope  approximately 2-3 months prior to admission.  At that time, he was  sitting in chair when he started to feel hot and passed out.  Further  history from the patient's family members include a few other times of  when he was sitting down and had similar episodes of slumping over for a  period of time before being aroused by his family members.   PHYSICAL EXAMINATION:  VITAL SIGNS AT ADMISSION:  Temperature 97.8,  heart rate 77, blood pressure 116/76, respiratory rate of 18, and oxygen  saturations of 98% on room air.  Orthostatic blood pressure and heart  rate measurements were taken as follows; lying 138/82 with heart rate of  73, sitting 122/85 with a heart rate of 81, and standing 126/84 with a  heart rate of 91.  His capillary  blood glucose in the emergency  department was 158.  GENERAL:  Alert, disheveled, no acute distress, cooperative for exam.  HEENT:  Head was normocephalic and atraumatic.  Eyes, vision was grossly  intact.  Pupils equal, round, and reactive to light.  Conjunctiva had no  pallor, no injection, or icterus.  Mouth, Pharynx pink and moist.  No  erythema and no exudates with poor dentition.  NECK:  Supple with full range of motion.  No thyromegaly, no JVD, and no  carotid bruits.  LUNGS:  Normal respiratory effort with no accessory muscle use and  markedly diminished breath sounds in the left lower base with mild  bibasilar crackles, left greater than right with no wheezes.  HEART:  Normal rate, regular rhythm.  No murmur, rub, or gallop.  ABDOMEN:  Soft, nontender, and normal bowel sounds.  No distention, no  guarding, and no rebound.  PULSES:  1+ dorsalis pedis and posterior tibial pulses bilaterally.  EXTREMITIES:  No cyanosis, clubbing, or edema.  NEUROLOGIC:  The the patient was alert and oriented x3.  Cranial nerves  II through XII were grossly intact.  Strength was normal in all  extremities and sensation was intact to light touch.  PSYCH:  The patient's memory was intact for recent and remote, normally  interactive with good eye contact, not anxious or depressed appearing.   LABORATORIES ON ADMISSION:  White blood cell count of 8.5, hemoglobin  15.7, hematocrit of 45.9, and platelets of 208.  Point-of-care cardiac  enzymes were as follows:  CK-MB of less than 1.0.  Troponin I of less  than 0.05.  Myoglobin of 103.  Coags were as follows; INR of 1.0,  protime of 12.8, and partial thromboplastin time of 25.  Basic metabolic  panel was as follows; sodium 134, potassium 4.0, chloride 100, bicarb  27, BUN of 12, creatinine of 1.2, glucose of 125, and a calcium of 9.1.   HOSPITAL COURSE:  1. Syncope.  Caleb Andrews symptoms that brought him to the hospital      for this visit were  concerning for cardiac versus neurologic versus      neurocardiogenic origin.  There were no focal deficits or weakness,      seen on exam making neurologic etiology less likely,      neurocardiogenic seemed more likely given the patient's positioning      and symptoms prior to passing out and feeling hot and standing      prior to the event occurring, but given the gravity  of cardiac      etiology, a cardiac workup was performed including cardiac enzymes,      which were negative x3.  A 2-D echo of the heart, which showed no      abnormalities that would be concerning for the etiology of the      syncope as well as consultations with Cardiology, who recommended a      CardioNet for 2 weeks as an outpatient and possible stress test.      Throughout the hospitalization, the patient was on telemetry and      did have some notable bradycardic events, but the patient was      unsymptomatic and after consultation with Cardiology, it was deemed      appropriate to have the patient follow up as an outpatient.  2. Diabetes mellitus.  Metformin was held throughout the      hospitalization, and the patient was on sliding scale insulin.  The      patient had very minimal requirements per sliding scale insulin.      The patient was on a dose of 1000 mg of metformin twice daily prior      to hospitalization and a review of his records showed hemoglobin      A1cs of 6.1 or lower throughout the last several years.  The first      was made to decrease his metformin dose to 500 mg once daily and to      follow up as an outpatient with CBGs and A1cs.  3. Hypertension.  The patient was on lisinopril 20 with      hydrochlorothiazide 12.5 two tablets once a day.  This may have      contributed to the patient having orthostatic symptoms given the      diuretic.  The patient never had any recorded high blood pressures      throughout the hospitalization for which we agreed with Cardiology      to decrease his  HCTZ dose.  We removed HCTZ from his hypertensive      regimen and we will continue the lisinopril 40 mg once daily.  His      blood pressures can be evaluated again as an outpatient and changes      may be made to his medications accordingly.   DISCHARGE LABORATORIES AND VITALS:  On the day of discharge, the  patient's vitals were as follows; temperature 97.3, heart rate of 73,  respiratory rate of 18 with a blood pressure of 139/91.   Notable labs were cardiac enzymes negative x3 and TSH was normal at  1.739.      Nilda Riggs, MD  Electronically Signed      Alvester Morin, M.D.  Electronically Signed    ME/MEDQ  D:  04/30/2009  T:  04/30/2009  Job:  [id number]   cc:   [facility]  Sheliah Mends, MD

## 2011-02-22 NOTE — Op Note (Signed)
[place]  Patient:    Caleb Andrews, Caleb Andrews              MRN: [id number] Proc. Date: 09/03/00 Adm. Date:  [id number] Attending:  Andre Lefort CC:         [facility]  Venita Lick. Pleas Koch., M.D. [facility]   Operative Report  DATE OF BIRTH:  10/01/1941  PREOPERATIVE DIAGNOSIS:  Small bowel obstruction.  POSTOPERATIVE DIAGNOSIS:  Small bowel obstruction secondary to small bowel adhesions where a loop of the small bowel had adhesed to itself where the bowel was bruised.  PROCEDURE:  Abdominal exploration with enterolysis of adhesions, placement of left subclavian triple lumen catheter.  SURGEON:  Sandria Bales. Ezzard Standing, M.D.  ASSISTANT:  Catalina Lunger, M.D.  ANESTHESIA:  General endotracheal.  ESTIMATED BLOOD LOSS:  50 cc.  DRAINS LEFT IN:  None.  INDICATIONS FOR PROCEDURE:  [name] is a 70 year old black male, who had a laparoscopically-assisted right hemicolectomy for a benign polyp approximately one week ago.  He has had significant abdominal distention with basically obstipation and KUB, which showed markedly dilated small bowel. Because of the persistent ______ one week out, I decided to reexplore him.  I talked about the continued observation versus surgery, but I was disappointed that he ______ any chance of improving without surgery.  I was also concerned about both his anastomosis and his small bowel.  DESCRIPTION OF PROCEDURE:  Patient was taken to the operating room, given a general endotracheal anesthesia as supervised by ______ and he already had NG tube in place, had a Foley catheter in place.  His staples were removed, his abdomen was prepped with Betadine solution and sterilely draped.  He was given 1 g of cefotetan at the initiation of the procedure.  A midline incision was made with sharp dissection and carried down to the abdominal cavity, where he was noted to have markedly dilated small  bowel.  It looked like there was in the distal one-third of the small bowel that some bruised loops had knuckled together and were stuck.  I saw no obvious bad injury to the bowel.  There was no need to resect any small bowel.  I was able to along his entire length of his small bowel and visualized no other kinks or internal hernias.  I was able to get a finger through his anastomosis and I think his anastomosis had at least a 3 cm opening through it.  I could push air through it, which I think may have communicated and he had no evidence of any gallbladder inflammation, liver was unremarkable, NG tube was in good position.  I milked the air and got fluid back from the small bowel to decompress it, laid the omentum over the abdomen and then closed the abdomen with running #1 Novofil suture, a running #1 PDS suture, interrupted #1 Novofil suture and two retention sutures of 2 Ethibond.  Patient in Trendelenburg position, prepped his left upper chest.  Using ______ introducer kit, I accessed the left subclavian vein with a 14-gauge needle, threaded a guidewire into the subclavian vein, got good backflow, then threaded a triple lumen over this guidewire, then secured it with a 3-0 silk suture, flushed each port with 100 units of heparin per cc.  Chest x-ray was pending at the time of dictation.  The patient tolerated the procedure well and again, it looked like he had just some bruised bowel, which had stuck on itself  and caused an obstruction.  I could find no obvious hernia, no obvious anastomotic problem or ischemic bowel.  He tolerated the procedure well.  I will place him on TPN now that he has a triple lumen catheter. DD:  09/03/00 TD:  09/03/00 Job: [id number]

## 2011-04-15 ENCOUNTER — Other Ambulatory Visit: Payer: Self-pay | Admitting: *Deleted

## 2011-04-15 DIAGNOSIS — I10 Essential (primary) hypertension: Secondary | ICD-10-CM

## 2011-04-30 ENCOUNTER — Encounter: Payer: Medicare Other | Admitting: Internal Medicine

## 2011-05-09 ENCOUNTER — Ambulatory Visit (INDEPENDENT_AMBULATORY_CARE_PROVIDER_SITE_OTHER): Payer: Medicare Other | Admitting: Internal Medicine

## 2011-05-09 ENCOUNTER — Encounter: Payer: Self-pay | Admitting: Internal Medicine

## 2011-05-09 ENCOUNTER — Encounter: Payer: Self-pay | Admitting: Gastroenterology

## 2011-05-09 VITALS — BP 160/100 | HR 81 | Temp 97.9°F | Wt 161.0 lb

## 2011-05-09 DIAGNOSIS — I10 Essential (primary) hypertension: Secondary | ICD-10-CM

## 2011-05-09 DIAGNOSIS — F1010 Alcohol abuse, uncomplicated: Secondary | ICD-10-CM

## 2011-05-09 DIAGNOSIS — Z0000 Encounter for general adult medical examination without abnormal findings: Secondary | ICD-10-CM

## 2011-05-09 DIAGNOSIS — M25569 Pain in unspecified knee: Secondary | ICD-10-CM

## 2011-05-09 DIAGNOSIS — E119 Type 2 diabetes mellitus without complications: Secondary | ICD-10-CM

## 2011-05-09 DIAGNOSIS — Z86010 Personal history of colonic polyps: Secondary | ICD-10-CM

## 2011-05-09 LAB — LIPID PANEL
Cholesterol: 125 mg/dL (ref 0–200)
LDL Cholesterol: 48 mg/dL (ref 0–99)
VLDL: 12 mg/dL (ref 0–40)

## 2011-05-09 MED ORDER — CARVEDILOL 6.25 MG PO TABS: 12.5000 mg | ORAL_TABLET | Freq: Two times a day (BID) | ORAL | Status: DC

## 2011-05-09 NOTE — Assessment & Plan Note (Signed)
He was counseled on alcohol cessation and educated on the harmful effects associated with alcohol abuse.

## 2011-05-09 NOTE — Progress Notes (Signed)
  Subjective:    Patient ID: Caleb Andrews, male    DOB: 1941-01-26, 70 y.o.   MRN: [id number]  HPI: 70 year old man with past medical history significant for alcohol abuse, hypertension, history of colon cancer status post resection comes to the clinic for a followup visit.  He reports excessive daytime sleeping. Asking in retrospect he told that for his job sometimes,  he has to get up at 2 or 3 AM in the morning. Sometimes when he sleeps till 6 or 7 AM in the morning, he thinks he had overslept. When asked about his drinking he states that he drinks about 1 pint every time when he has money.  He was complaining of some right knee pain is worse with walking.  Otherwise denies any chest pain, shortness of breath, fever, bladder or bowel complaints.    Review of Systems  Constitutional: Negative for diaphoresis, activity change and appetite change.  HENT: Negative for congestion, rhinorrhea and postnasal drip.   Respiratory: Negative for apnea, cough, choking, chest tightness, shortness of breath and stridor.   Cardiovascular: Negative for chest pain, palpitations and leg swelling.  Gastrointestinal: Negative for abdominal pain and abdominal distention.  Genitourinary: Negative for dysuria, frequency, hematuria and difficulty urinating.  Musculoskeletal:       Positive for right knee pain  Neurological: Negative for dizziness, speech difficulty and numbness.  Hematological: Negative for adenopathy.       Objective:   Physical Exam  Constitutional: He is oriented to person, place, and time. He appears well-developed and well-nourished. No distress.  HENT:  Head: Normocephalic and atraumatic.  Eyes: Conjunctivae and EOM are normal. Pupils are equal, round, and reactive to light.  Neck: Normal range of motion. Neck supple. No JVD present. No tracheal deviation present. No thyromegaly present.  Cardiovascular: Normal rate, regular rhythm, normal heart sounds and intact distal  pulses.  Exam reveals no gallop and no friction rub.   No murmur heard. Pulmonary/Chest: Effort normal and breath sounds normal. No stridor. No respiratory distress. He has no wheezes. He has no rales. He exhibits no tenderness.  Abdominal: Soft. Bowel sounds are normal. He exhibits no distension and no mass. There is no tenderness. There is no rebound and no guarding.       abdominal scar post surgery noticed in midline below umblicus  Musculoskeletal: Normal range of motion. He exhibits no edema and no tenderness.  Lymphadenopathy:    He has no cervical adenopathy.  Neurological: He is alert and oriented to person, place, and time. He has normal reflexes. He displays normal reflexes. No cranial nerve deficit. He exhibits normal muscle tone. Coordination normal.  Skin: He is not diaphoretic.          Assessment & Plan:

## 2011-05-09 NOTE — Assessment & Plan Note (Signed)
Will refer him for physical therapy.

## 2011-05-09 NOTE — Assessment & Plan Note (Signed)
He  has not had any colonoscopy since 2003. His colonoscopy appointment was scheduled based on the previous orders from February 2012. Patient was informed about the date and time.

## 2011-05-09 NOTE — Assessment & Plan Note (Addendum)
His A1c with today's visit was 5.4 and his previous A1c's have been 5.8 and 6.3. I doubt that he has type 2 diabetes mellitus. Will take him off from metformin and recheck his A1c in 3 months again. Will also check FLP today

## 2011-05-09 NOTE — Assessment & Plan Note (Addendum)
His blood pressure was high at today's visit . He told us that he took his blood pressure medications in the hallway about 15 minutes prior to checking  him. His recheck blood pressure 30 minutes was still 160/110. Will increase his dose of coronary from 6.25 to 12.5 twice daily.  He was advised to followup in 3 weeks for blood pressure check. Lab Results  Component Value Date   NA 136 11/30/2010   K 3.6 11/30/2010   CL 98 11/30/2010   CO2 28 11/30/2010   BUN 12 11/30/2010   CREATININE 1.04 11/30/2010   CREATININE 1.27 04/26/2009    BP Readings from Last 3 Encounters:  05/09/11 160/100  11/30/10 167/108  07/05/10 147/92

## 2011-05-09 NOTE — Patient Instructions (Addendum)
Please take your medicines as prescribed. Please schedule a follow up appointment in 3 weeks for BP recheck. Your A1 C today is 5.4, please do not take your metformin.

## 2011-05-31 ENCOUNTER — Encounter: Payer: Medicare Other | Admitting: Internal Medicine

## 2011-06-21 ENCOUNTER — Other Ambulatory Visit: Payer: Medicare Other | Admitting: Gastroenterology

## 2011-09-11 ENCOUNTER — Ambulatory Visit (INDEPENDENT_AMBULATORY_CARE_PROVIDER_SITE_OTHER): Payer: Medicare Other | Admitting: Gastroenterology

## 2011-09-11 ENCOUNTER — Encounter: Payer: Self-pay | Admitting: Gastroenterology

## 2011-09-11 VITALS — BP 92/58 | HR 64 | Ht 72.0 in | Wt 174.6 lb

## 2011-09-11 DIAGNOSIS — Z860100 Personal history of colon polyps, unspecified: Secondary | ICD-10-CM

## 2011-09-11 DIAGNOSIS — R159 Full incontinence of feces: Secondary | ICD-10-CM

## 2011-09-11 DIAGNOSIS — R197 Diarrhea, unspecified: Secondary | ICD-10-CM

## 2011-09-11 MED ORDER — PEG-KCL-NACL-NASULF-NA ASC-C 100 G PO SOLR: 1.0000 | Freq: Once | ORAL | Status: DC

## 2011-09-11 NOTE — Patient Instructions (Addendum)
You have been scheduled for a Colonoscopy with propofol. See separate instructions.  Pick up your prep kit from your pharmacy.  Use Lactaid pills with milk products or avoid milk products.  cc: Deatra Robinson, MD       Nicki Reaper, MD
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
## 2011-09-12 ENCOUNTER — Encounter: Payer: Self-pay | Admitting: Gastroenterology

## 2011-09-12 ENCOUNTER — Encounter: Payer: Medicare Other | Admitting: Gastroenterology

## 2011-09-17 ENCOUNTER — Encounter: Payer: Self-pay | Admitting: Gastroenterology

## 2011-09-24 ENCOUNTER — Ambulatory Visit (AMBULATORY_SURGERY_CENTER): Payer: Medicare Other | Admitting: Gastroenterology

## 2011-09-24 ENCOUNTER — Other Ambulatory Visit: Payer: Self-pay | Admitting: Gastroenterology

## 2011-09-24 ENCOUNTER — Encounter: Payer: Self-pay | Admitting: Gastroenterology

## 2011-09-24 ENCOUNTER — Other Ambulatory Visit: Payer: Medicare Other | Admitting: Gastroenterology

## 2011-09-24 DIAGNOSIS — Z1211 Encounter for screening for malignant neoplasm of colon: Secondary | ICD-10-CM

## 2011-09-24 DIAGNOSIS — R197 Diarrhea, unspecified: Secondary | ICD-10-CM

## 2011-09-24 DIAGNOSIS — Z86010 Personal history of colonic polyps: Secondary | ICD-10-CM

## 2011-09-24 DIAGNOSIS — D126 Benign neoplasm of colon, unspecified: Secondary | ICD-10-CM

## 2011-09-24 DIAGNOSIS — R159 Full incontinence of feces: Secondary | ICD-10-CM

## 2011-09-24 LAB — GLUCOSE, CAPILLARY
Glucose-Capillary: 69 mg/dL — ABNORMAL LOW (ref 70–99)
Glucose-Capillary: 88 mg/dL (ref 70–99)

## 2011-09-24 MED ORDER — SODIUM CHLORIDE 0.9 % IV SOLN: 500.0000 mL | INTRAVENOUS | Status: DC

## 2011-09-24 NOTE — Patient Instructions (Signed)
Green and blue discharge instructions reviewed with care partner and patient.  Impressions/recommendations:  Polyps (handout given) Diverticulosis (handout given)  Repeat colonoscopy in 5 years.   Resume medications as you were taking them prior to your procedure.

## 2011-09-24 NOTE — Progress Notes (Signed)
Patient did not experience any of the following events: a burn prior to discharge; a fall within the facility; wrong site/side/patient/procedure/implant event; or a hospital transfer or hospital admission upon discharge from the facility. (G8907) Patient did not have preoperative order for IV antibiotic SSI prophylaxis. (G8918)  

## 2011-09-24 NOTE — Op Note (Signed)
[facility] [address] [place], [place]  [address]  COLONOSCOPY PROCEDURE REPORT PATIENT:  Caleb Andrews, Caleb Andrews  MR#:  [id number] BIRTHDATE:  04-08-1941, 70 yrs. old  GENDER:  male ENDOSCOPIST:  Judie Petit T. Russella Dar, MD, [facility]  PROCEDURE DATE:  09/24/2011 PROCEDURE:  Colonoscopy with snare polypectomy ASA CLASS:  Class II INDICATIONS:  1) surveillance and high-risk screening  2) history of pre-cancerous (adenomatous) colon polyps : TVA 2001. MEDICATIONS:   These medications were titrated to patient response per physician's verbal order, Benadryl 25 mg IV, Fentanyl 75 mcg IV, Versed 6 mg IV DESCRIPTION OF PROCEDURE:   After the risks benefits and alternatives of the procedure were thoroughly explained, informed consent was obtained.  Digital rectal exam was performed and revealed no abnormalities.   The LB 180AL [id number] endoscope was introduced through the anus and advanced to the terminal ileum which was intubated for a short distance, without limitations. The quality of the prep was adequate, using MoviPrep.  The instrument was then slowly withdrawn as the colon was fully examined. <<PROCEDUREIMAGES>> FINDINGS:  The right colon was surgically resected and an ileo-colonic anastamosis was seen. Two polyps were found in the transverse colon. They were 4 - 5 mm in size. Polyps were snared without cautery. Retrieval was successful. snare polyp  Mild diverticulosis was found in the descending colon. Otherwise normal colonoscopy without other polyps, masses, vascular ectasias, or inflammatory changes. Retroflexed views in the rectum revealed no abnormalities. The time to cecum =  2.5  minutes. The scope was then withdrawn (time =  11.75  min) from the patient and the procedure completed.  COMPLICATIONS:  None  ENDOSCOPIC IMPRESSION: 1) Prior right hemi-colectomy 2) 4 - 5 mm Two polyps in the transverse colon 3) Mild diverticulosis in the descending  colon  RECOMMENDATIONS: 1) Await pathology results 2) High fiber diet with liberal fluid intake. 3) Repeat Colonoscopy in 5 years.  Venita Lick. Russella Dar, MD, [facility]  n. eSIGNED:   Venita Lick. Marquinn Meschke at 09/24/2011 03:54 PM  Frederich Chick, [id number]

## 2011-09-24 NOTE — Progress Notes (Signed)
Patient care provided by [facility] care, healthcare agency. Pt lives at a care home. Care provider left, not aware of need to stay with patient for procedure. Arrived back to [facility], informed of report and CBG's, discharge teaching done.   Spoke with Wilnette Kales of [facility], they are unaware of the name of the healthcare agency caring for this patient, thought it was [facility]. Freddy of [employer], informed of circumstances and following through.

## 2011-09-25 ENCOUNTER — Telehealth: Payer: Self-pay | Admitting: *Deleted

## 2011-09-25 NOTE — Telephone Encounter (Signed)
Follow up Call- Patient questions:  Do you have a fever, pain , or abdominal swelling? no Pain Score  0 *  Have you tolerated food without any problems? yes  Have you been able to return to your normal activities? yes  Do you have any questions about your discharge instructions: Diet   no Medications  no Follow up visit  no  Do you have questions or concerns about your Care? no  Actions: * If pain score is 4 or above: No action needed, pain <4.  Spoke with nurse at care center.

## 2011-10-01 ENCOUNTER — Encounter: Payer: Self-pay | Admitting: Gastroenterology

## 2011-10-15 ENCOUNTER — Encounter: Payer: Medicare Other | Admitting: Gastroenterology

## 2013-04-15 ENCOUNTER — Other Ambulatory Visit: Payer: Self-pay

## 2014-04-15 ENCOUNTER — Emergency Department (HOSPITAL_COMMUNITY): Payer: Medicare Other

## 2014-04-15 ENCOUNTER — Encounter (HOSPITAL_COMMUNITY): Payer: Self-pay | Admitting: Emergency Medicine

## 2014-04-15 ENCOUNTER — Emergency Department (HOSPITAL_COMMUNITY)
Admission: EM | Admit: 2014-04-15 | Discharge: 2014-04-16 | Disposition: A | Discharge status: Home / Self Care | Payer: Medicare Other | Attending: Emergency Medicine | Admitting: Emergency Medicine

## 2014-04-15 DIAGNOSIS — Y9289 Other specified places as the place of occurrence of the external cause: Secondary | ICD-10-CM | POA: Diagnosis not present

## 2014-04-15 DIAGNOSIS — Y9389 Activity, other specified: Secondary | ICD-10-CM | POA: Insufficient documentation

## 2014-04-15 DIAGNOSIS — I10 Essential (primary) hypertension: Secondary | ICD-10-CM | POA: Diagnosis not present

## 2014-04-15 DIAGNOSIS — F3289 Other specified depressive episodes: Secondary | ICD-10-CM | POA: Diagnosis not present

## 2014-04-15 DIAGNOSIS — F329 Major depressive disorder, single episode, unspecified: Secondary | ICD-10-CM | POA: Insufficient documentation

## 2014-04-15 DIAGNOSIS — E119 Type 2 diabetes mellitus without complications: Secondary | ICD-10-CM | POA: Diagnosis not present

## 2014-04-15 DIAGNOSIS — X58XXXA Exposure to other specified factors, initial encounter: Secondary | ICD-10-CM | POA: Diagnosis not present

## 2014-04-15 DIAGNOSIS — Z9089 Acquired absence of other organs: Secondary | ICD-10-CM | POA: Insufficient documentation

## 2014-04-15 DIAGNOSIS — R404 Transient alteration of awareness: Secondary | ICD-10-CM | POA: Diagnosis present

## 2014-04-15 DIAGNOSIS — R55 Syncope and collapse: Secondary | ICD-10-CM | POA: Diagnosis not present

## 2014-04-15 DIAGNOSIS — F17200 Nicotine dependence, unspecified, uncomplicated: Secondary | ICD-10-CM | POA: Insufficient documentation

## 2014-04-15 DIAGNOSIS — Z79899 Other long term (current) drug therapy: Secondary | ICD-10-CM | POA: Diagnosis not present

## 2014-04-15 DIAGNOSIS — Z8719 Personal history of other diseases of the digestive system: Secondary | ICD-10-CM | POA: Diagnosis not present

## 2014-04-15 DIAGNOSIS — R630 Anorexia: Secondary | ICD-10-CM | POA: Diagnosis not present

## 2014-04-15 DIAGNOSIS — Z7982 Long term (current) use of aspirin: Secondary | ICD-10-CM | POA: Insufficient documentation

## 2014-04-15 DIAGNOSIS — IMO0002 Reserved for concepts with insufficient information to code with codable children: Secondary | ICD-10-CM | POA: Insufficient documentation

## 2014-04-15 DIAGNOSIS — M129 Arthropathy, unspecified: Secondary | ICD-10-CM | POA: Insufficient documentation

## 2014-04-15 DIAGNOSIS — Z85038 Personal history of other malignant neoplasm of large intestine: Secondary | ICD-10-CM | POA: Insufficient documentation

## 2014-04-15 LAB — URINALYSIS, ROUTINE W REFLEX MICROSCOPIC
Bilirubin Urine: NEGATIVE
Glucose, UA: NEGATIVE mg/dL
HGB URINE DIPSTICK: NEGATIVE
Ketones, ur: 15 mg/dL — AB
LEUKOCYTES UA: NEGATIVE
NITRITE: NEGATIVE
PROTEIN: NEGATIVE mg/dL
SPECIFIC GRAVITY, URINE: 1.013 (ref 1.005–1.030)
Urobilinogen, UA: 0.2 mg/dL (ref 0.0–1.0)
pH: 5 (ref 5.0–8.0)

## 2014-04-15 LAB — COMPREHENSIVE METABOLIC PANEL
ALBUMIN: 3.4 g/dL — AB (ref 3.5–5.2)
ALT: 10 U/L (ref 0–53)
AST: 12 U/L (ref 0–37)
Alkaline Phosphatase: 71 U/L (ref 39–117)
Anion gap: 21 — ABNORMAL HIGH (ref 5–15)
BUN: 9 mg/dL (ref 6–23)
CALCIUM: 9.1 mg/dL (ref 8.4–10.5)
CO2: 22 meq/L (ref 19–32)
CREATININE: 1.18 mg/dL (ref 0.50–1.35)
Chloride: 94 mEq/L — ABNORMAL LOW (ref 96–112)
GFR calc Af Amer: 69 mL/min — ABNORMAL LOW (ref >90)
GFR, EST NON AFRICAN AMERICAN: 60 mL/min — AB (ref >90)
Glucose, Bld: 80 mg/dL (ref 70–99)
Potassium: 3.9 mEq/L (ref 3.7–5.3)
Sodium: 137 mEq/L (ref 137–147)
Total Bilirubin: 0.2 mg/dL — ABNORMAL LOW (ref 0.3–1.2)
Total Protein: 6.7 g/dL (ref 6.0–8.3)

## 2014-04-15 LAB — CBC WITH DIFFERENTIAL/PLATELET
Basophils Absolute: 0 10*3/uL (ref 0.0–0.1)
Basophils Relative: 0 % (ref 0–1)
EOS PCT: 3 % (ref 0–5)
Eosinophils Absolute: 0.2 10*3/uL (ref 0.0–0.7)
HCT: 32.5 % — ABNORMAL LOW (ref 39.0–52.0)
Hemoglobin: 10.9 g/dL — ABNORMAL LOW (ref 13.0–17.0)
LYMPHS ABS: 1.9 10*3/uL (ref 0.7–4.0)
LYMPHS PCT: 35 % (ref 12–46)
MCH: 28.8 pg (ref 26.0–34.0)
MCHC: 33.5 g/dL (ref 30.0–36.0)
MCV: 86 fL (ref 78.0–100.0)
Monocytes Absolute: 0.5 10*3/uL (ref 0.1–1.0)
Monocytes Relative: 9 % (ref 3–12)
NEUTROS ABS: 2.9 10*3/uL (ref 1.7–7.7)
Neutrophils Relative %: 53 % (ref 43–77)
PLATELETS: 209 10*3/uL (ref 150–400)
RBC: 3.78 MIL/uL — AB (ref 4.22–5.81)
RDW: 13.7 % (ref 11.5–15.5)
WBC: 5.4 10*3/uL (ref 4.0–10.5)

## 2014-04-15 LAB — ETHANOL: ALCOHOL ETHYL (B): 19 mg/dL — AB (ref 0–11)

## 2014-04-15 LAB — TROPONIN I

## 2014-04-15 LAB — CK: CK TOTAL: 177 U/L (ref 7–232)

## 2014-04-15 MED ORDER — SODIUM CHLORIDE 0.9 % IV BOLUS (SEPSIS)
1000.0000 mL | Freq: Once | INTRAVENOUS | Status: AC
  Administered 2014-04-15: 1000 mL via INTRAVENOUS
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
## 2014-04-15 NOTE — ED Notes (Signed)
Patient transported to CT 

## 2014-04-15 NOTE — ED Notes (Signed)
Pt was able to tolerate PO intake and ambulated independently

## 2014-04-15 NOTE — ED Notes (Signed)
Pt lives at [facility].

## 2014-04-15 NOTE — ED Notes (Signed)
Patient transported to X-ray 

## 2014-04-15 NOTE — Discharge Instructions (Signed)
Near-Syncope Near-syncope (commonly known as near fainting) is sudden weakness, dizziness, or feeling like you might pass out. During an episode of near-syncope, you may also develop pale skin, have tunnel vision, or feel sick to your stomach (nauseous). Near-syncope may occur when getting up after sitting or while standing for a long time. It is caused by a sudden decrease in blood flow to the brain. This decrease can result from various causes or triggers, most of which are not serious. However, because near-syncope can sometimes be a sign of something serious, a medical evaluation is required. The specific cause is often not determined. HOME CARE INSTRUCTIONS  Monitor your condition for any changes. The following actions may help to alleviate any discomfort you are experiencing:  Have someone stay with you until you feel stable.  Lie down right away and prop your feet up if you start feeling like you might faint. Breathe deeply and steadily. Wait until all the symptoms have passed. Most of these episodes last only a few minutes. You may feel tired for several hours.   Drink enough fluids to keep your urine clear or pale yellow.   If you are taking blood pressure or heart medicine, get up slowly when seated or lying down. Take several minutes to sit and then stand. This can reduce dizziness.  Follow up with your health care provider as directed. SEEK IMMEDIATE MEDICAL CARE IF:   You have a severe headache.   You have unusual pain in the chest, abdomen, or back.   You are bleeding from the mouth or rectum, or you have black or tarry stool.   You have an irregular or very fast heartbeat.   You have repeated fainting or have seizure-like jerking during an episode.   You faint when sitting or lying down.   You have confusion.   You have difficulty walking.   You have severe weakness.   You have vision problems.  MAKE SURE YOU:   Understand these instructions.  Will  watch your condition.  Will get help right away if you are not doing well or get worse. Document Released: 09/23/2005 Document Revised: 09/28/2013 Document Reviewed: 02/26/2013 Exit[employer] Patient Information 2015 [employer]. This information is not intended to replace advice given to you by your health care provider. Make sure you discuss any questions you have with your health care provider.  

## 2014-04-16 NOTE — ED Notes (Signed)
Barbara at [facility] notified that pt would be coming back via [facility], and given update on pts condition.

## 2014-04-18 LAB — POC OCCULT BLOOD, ED: FECAL OCCULT BLD: NEGATIVE

## 2016-06-27 ENCOUNTER — Encounter (HOSPITAL_COMMUNITY): Payer: Self-pay | Admitting: Emergency Medicine

## 2016-06-27 ENCOUNTER — Emergency Department (HOSPITAL_COMMUNITY)
Admission: EM | Admit: 2016-06-27 | Discharge: 2016-06-27 | Disposition: A | Discharge status: Home / Self Care | Payer: Medicare Other | Attending: Emergency Medicine | Admitting: Emergency Medicine

## 2016-06-27 DIAGNOSIS — I10 Essential (primary) hypertension: Secondary | ICD-10-CM | POA: Insufficient documentation

## 2016-06-27 DIAGNOSIS — Z7982 Long term (current) use of aspirin: Secondary | ICD-10-CM | POA: Diagnosis not present

## 2016-06-27 DIAGNOSIS — F17210 Nicotine dependence, cigarettes, uncomplicated: Secondary | ICD-10-CM | POA: Diagnosis not present

## 2016-06-27 DIAGNOSIS — R42 Dizziness and giddiness: Secondary | ICD-10-CM | POA: Insufficient documentation

## 2016-06-27 DIAGNOSIS — Z85038 Personal history of other malignant neoplasm of large intestine: Secondary | ICD-10-CM | POA: Diagnosis not present

## 2016-06-27 DIAGNOSIS — E1140 Type 2 diabetes mellitus with diabetic neuropathy, unspecified: Secondary | ICD-10-CM | POA: Diagnosis not present

## 2016-06-27 DIAGNOSIS — Z7984 Long term (current) use of oral hypoglycemic drugs: Secondary | ICD-10-CM | POA: Diagnosis not present

## 2016-06-27 LAB — CBC WITH DIFFERENTIAL/PLATELET
Basophils Absolute: 0.1 10*3/uL (ref 0.0–0.1)
Basophils Relative: 1 %
EOS ABS: 0.2 10*3/uL (ref 0.0–0.7)
Eosinophils Relative: 3 %
HCT: 34.7 % — ABNORMAL LOW (ref 39.0–52.0)
HEMOGLOBIN: 11.2 g/dL — AB (ref 13.0–17.0)
LYMPHS PCT: 29 %
Lymphs Abs: 1.4 10*3/uL (ref 0.7–4.0)
MCH: 28.3 pg (ref 26.0–34.0)
MCHC: 32.3 g/dL (ref 30.0–36.0)
MCV: 87.6 fL (ref 78.0–100.0)
MONOS PCT: 10 %
Monocytes Absolute: 0.5 10*3/uL (ref 0.1–1.0)
Neutro Abs: 2.7 10*3/uL (ref 1.7–7.7)
Neutrophils Relative %: 57 %
Platelets: 246 10*3/uL (ref 150–400)
RBC: 3.96 MIL/uL — ABNORMAL LOW (ref 4.22–5.81)
RDW: 14.3 % (ref 11.5–15.5)
WBC: 4.7 10*3/uL (ref 4.0–10.5)

## 2016-06-27 LAB — BASIC METABOLIC PANEL
Anion gap: 9 (ref 5–15)
BUN: 11 mg/dL (ref 6–20)
CO2: 27 mmol/L (ref 22–32)
Calcium: 9.6 mg/dL (ref 8.9–10.3)
Chloride: 99 mmol/L — ABNORMAL LOW (ref 101–111)
Creatinine, Ser: 1.34 mg/dL — ABNORMAL HIGH (ref 0.61–1.24)
GFR calc Af Amer: 59 mL/min — ABNORMAL LOW (ref >60)
GFR calc non Af Amer: 51 mL/min — ABNORMAL LOW (ref >60)
GLUCOSE: 92 mg/dL (ref 65–99)
POTASSIUM: 4.1 mmol/L (ref 3.5–5.1)
SODIUM: 135 mmol/L (ref 135–145)

## 2016-06-27 LAB — I-STAT TROPONIN, ED: TROPONIN I, POC: 0 ng/mL (ref 0.00–0.08)

## 2016-06-27 LAB — URINALYSIS, ROUTINE W REFLEX MICROSCOPIC
Bilirubin Urine: NEGATIVE
GLUCOSE, UA: NEGATIVE mg/dL
Hgb urine dipstick: NEGATIVE
Ketones, ur: NEGATIVE mg/dL
LEUKOCYTES UA: NEGATIVE
NITRITE: NEGATIVE
Protein, ur: NEGATIVE mg/dL
SPECIFIC GRAVITY, URINE: 1.017 (ref 1.005–1.030)
pH: 6.5 (ref 5.0–8.0)

## 2016-06-27 NOTE — Discharge Instructions (Signed)
Encourage fluid intake. Follow up with primary care doctor for recheck. Return if worsening.
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
## 2016-06-27 NOTE — ED Triage Notes (Signed)
Pt was up in rocking chair at [facility] about 1015 and he became syncopal and slumped  In chair did not respond for a few mions and then came aroud was a little sweaty around forehead oper ems and bp was 92/62. Pt had eaten breakfast has iv 20 left ac

## 2016-08-09 ENCOUNTER — Encounter: Payer: Self-pay | Admitting: Gastroenterology

## 2016-09-15 ENCOUNTER — Emergency Department (HOSPITAL_COMMUNITY)
Admission: EM | Admit: 2016-09-15 | Discharge: 2016-09-15 | Disposition: A | Discharge status: Home / Self Care | Payer: Medicare Other | Attending: Emergency Medicine | Admitting: Emergency Medicine

## 2016-09-15 DIAGNOSIS — E119 Type 2 diabetes mellitus without complications: Secondary | ICD-10-CM | POA: Diagnosis not present

## 2016-09-15 DIAGNOSIS — Z7982 Long term (current) use of aspirin: Secondary | ICD-10-CM | POA: Insufficient documentation

## 2016-09-15 DIAGNOSIS — F17210 Nicotine dependence, cigarettes, uncomplicated: Secondary | ICD-10-CM | POA: Diagnosis not present

## 2016-09-15 DIAGNOSIS — Z7984 Long term (current) use of oral hypoglycemic drugs: Secondary | ICD-10-CM | POA: Insufficient documentation

## 2016-09-15 DIAGNOSIS — R55 Syncope and collapse: Secondary | ICD-10-CM | POA: Diagnosis present

## 2016-09-15 DIAGNOSIS — Z85038 Personal history of other malignant neoplasm of large intestine: Secondary | ICD-10-CM | POA: Insufficient documentation

## 2016-09-15 DIAGNOSIS — I10 Essential (primary) hypertension: Secondary | ICD-10-CM | POA: Insufficient documentation

## 2016-09-15 LAB — BASIC METABOLIC PANEL
Anion gap: 8 (ref 5–15)
BUN: 10 mg/dL (ref 6–20)
CHLORIDE: 101 mmol/L (ref 101–111)
CO2: 27 mmol/L (ref 22–32)
CREATININE: 1.5 mg/dL — AB (ref 0.61–1.24)
Calcium: 9.4 mg/dL (ref 8.9–10.3)
GFR calc Af Amer: 51 mL/min — ABNORMAL LOW (ref >60)
GFR calc non Af Amer: 44 mL/min — ABNORMAL LOW (ref >60)
GLUCOSE: 113 mg/dL — AB (ref 65–99)
Potassium: 4.1 mmol/L (ref 3.5–5.1)
SODIUM: 136 mmol/L (ref 135–145)

## 2016-09-15 LAB — CBC WITH DIFFERENTIAL/PLATELET
Basophils Absolute: 0 10*3/uL (ref 0.0–0.1)
Basophils Relative: 1 %
EOS ABS: 0.2 10*3/uL (ref 0.0–0.7)
EOS PCT: 3 %
HCT: 32.9 % — ABNORMAL LOW (ref 39.0–52.0)
HEMOGLOBIN: 10.8 g/dL — AB (ref 13.0–17.0)
LYMPHS ABS: 1.4 10*3/uL (ref 0.7–4.0)
Lymphocytes Relative: 22 %
MCH: 27.8 pg (ref 26.0–34.0)
MCHC: 32.8 g/dL (ref 30.0–36.0)
MCV: 84.8 fL (ref 78.0–100.0)
MONOS PCT: 10 %
Monocytes Absolute: 0.6 10*3/uL (ref 0.1–1.0)
Neutro Abs: 4.1 10*3/uL (ref 1.7–7.7)
Neutrophils Relative %: 64 %
PLATELETS: 268 10*3/uL (ref 150–400)
RBC: 3.88 MIL/uL — ABNORMAL LOW (ref 4.22–5.81)
RDW: 14.4 % (ref 11.5–15.5)
WBC: 6.3 10*3/uL (ref 4.0–10.5)

## 2016-09-15 LAB — TROPONIN I

## 2016-09-15 MED ORDER — SODIUM CHLORIDE 0.9 % IV SOLN
INTRAVENOUS | Status: DC
  Administered 2016-09-15: 12:00:00 via INTRAVENOUS

## 2016-09-15 MED ORDER — SODIUM CHLORIDE 0.9 % IV BOLUS (SEPSIS)
500.0000 mL | Freq: Once | INTRAVENOUS | Status: AC
  Administered 2016-09-15: 500 mL via INTRAVENOUS
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
## 2016-09-15 NOTE — Discharge Instructions (Signed)
Call your Dr. tomorrow to schedule an appointment to have your medications adjusted due to your decreasing creatinine function

## 2016-09-15 NOTE — ED Triage Notes (Signed)
Per [facility] patient was standing in church during prayer when he suddenly felt weak and sat down into the pew.  Patient is alert and oriented at this time.  Patient denies LOC.  On EMS arrival patient was diaphoretic and hypotensive with a HR in the mid 40s.  Patient currently 107/65 with HR 52.

## 2016-10-26 ENCOUNTER — Encounter (HOSPITAL_COMMUNITY): Payer: Self-pay

## 2016-10-26 ENCOUNTER — Emergency Department (HOSPITAL_COMMUNITY)
Admission: EM | Admit: 2016-10-26 | Discharge: 2016-10-26 | Disposition: A | Discharge status: Home / Self Care | Payer: Medicare Other | Attending: Emergency Medicine | Admitting: Emergency Medicine

## 2016-10-26 DIAGNOSIS — F17210 Nicotine dependence, cigarettes, uncomplicated: Secondary | ICD-10-CM | POA: Diagnosis not present

## 2016-10-26 DIAGNOSIS — I10 Essential (primary) hypertension: Secondary | ICD-10-CM | POA: Insufficient documentation

## 2016-10-26 DIAGNOSIS — Z7982 Long term (current) use of aspirin: Secondary | ICD-10-CM | POA: Diagnosis not present

## 2016-10-26 DIAGNOSIS — Z79899 Other long term (current) drug therapy: Secondary | ICD-10-CM | POA: Insufficient documentation

## 2016-10-26 DIAGNOSIS — Z85038 Personal history of other malignant neoplasm of large intestine: Secondary | ICD-10-CM | POA: Diagnosis not present

## 2016-10-26 DIAGNOSIS — E1140 Type 2 diabetes mellitus with diabetic neuropathy, unspecified: Secondary | ICD-10-CM | POA: Insufficient documentation

## 2016-10-26 DIAGNOSIS — M109 Gout, unspecified: Secondary | ICD-10-CM

## 2016-10-26 DIAGNOSIS — M1990 Unspecified osteoarthritis, unspecified site: Secondary | ICD-10-CM | POA: Insufficient documentation

## 2016-10-26 DIAGNOSIS — Z7984 Long term (current) use of oral hypoglycemic drugs: Secondary | ICD-10-CM | POA: Insufficient documentation

## 2016-10-26 DIAGNOSIS — M79642 Pain in left hand: Secondary | ICD-10-CM | POA: Diagnosis present

## 2016-10-26 MED ORDER — HYDROCODONE-ACETAMINOPHEN 5-325 MG PO TABS
1.0000 | ORAL_TABLET | Freq: Once | ORAL | Status: AC
  Administered 2016-10-26: 1 via ORAL
  Filled 2016-10-26: qty 1

## 2016-10-26 MED ORDER — PREDNISONE 50 MG PO TABS: 50.0000 mg | ORAL_TABLET | Freq: Every day | ORAL | 0 refills | Status: DC

## 2016-10-26 MED ORDER — HYDROCODONE-ACETAMINOPHEN 5-325 MG PO TABS: 1.0000 | ORAL_TABLET | ORAL | 0 refills | Status: AC | PRN

## 2016-10-26 MED ORDER — PREDNISONE 20 MG PO TABS
60.0000 mg | ORAL_TABLET | Freq: Once | ORAL | Status: AC
  Administered 2016-10-26: 60 mg via ORAL
  Filled 2016-10-26: qty 3

## 2016-10-26 NOTE — ED Triage Notes (Signed)
Per [facility]- Patient is a resident of [facility]. Patient c/o gout left hand x 2 days.

## 2016-10-26 NOTE — Discharge Instructions (Signed)
Start taking prednisone and vicodin for pain and inflammation.  Prednisone may make your sugar go up, so please check this.  Follow up with your primary care physician in 1 week.  Return to the ED for any new or concerning symptoms.
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
## 2017-07-25 ENCOUNTER — Encounter: Payer: Self-pay | Admitting: Gastroenterology

## 2017-09-09 ENCOUNTER — Ambulatory Visit (AMBULATORY_SURGERY_CENTER): Payer: Self-pay

## 2017-09-09 ENCOUNTER — Other Ambulatory Visit: Payer: Self-pay

## 2017-09-09 VITALS — Ht 72.0 in | Wt 186.0 lb

## 2017-09-09 DIAGNOSIS — Z86010 Personal history of colonic polyps: Secondary | ICD-10-CM

## 2017-09-09 MED ORDER — NA SULFATE-K SULFATE-MG SULF 17.5-3.13-1.6 GM/177ML PO SOLN: 1.0000 | Freq: Once | ORAL | 0 refills | Status: AC

## 2017-09-09 NOTE — Progress Notes (Signed)
Denies allergies to eggs or soy products. Denies complication of anesthesia or sedation. Denies use of weight loss medication. Denies use of O2.   Emmi instructions declined.    Patients bowel kit ( Suprep ) was called to [facility] in  [place]. I spoke with Birdie Hopes will be delivered to the nursing home. Instructions printed for the nurses at the home. Also a note to bring a current list of medications were written to the nurses who will instruct the patient in preparing for the colonoscopy.   Riki Sheer, LPN

## 2017-09-23 ENCOUNTER — Encounter: Payer: Self-pay | Admitting: Gastroenterology

## 2017-09-23 ENCOUNTER — Ambulatory Visit (AMBULATORY_SURGERY_CENTER): Payer: Medicare Other | Admitting: Gastroenterology

## 2017-09-23 VITALS — BP 148/90 | HR 52 | Temp 95.9°F | Resp 13 | Ht 72.0 in | Wt 186.0 lb

## 2017-09-23 DIAGNOSIS — D123 Benign neoplasm of transverse colon: Secondary | ICD-10-CM

## 2017-09-23 DIAGNOSIS — Z86010 Personal history of colonic polyps: Secondary | ICD-10-CM

## 2017-09-23 DIAGNOSIS — D124 Benign neoplasm of descending colon: Secondary | ICD-10-CM

## 2017-09-23 DIAGNOSIS — D125 Benign neoplasm of sigmoid colon: Secondary | ICD-10-CM

## 2017-09-23 MED ORDER — SODIUM CHLORIDE 0.9 % IV SOLN: 500.0000 mL | INTRAVENOUS | Status: DC

## 2017-09-23 NOTE — Op Note (Signed)
[place] Patient Name: Caleb Andrews Procedure Date: 09/23/2017 9:40 AM MRN: [id number] Endoscopist: Ladene Artist , MD Age: 76 Referring MD:  Date of Birth: 15-Feb-1941 Gender: Male Account #: [id number] Procedure:                Colonoscopy Indications:              Surveillance: Personal history of adenomatous                            polyps on last colonoscopy > 5 years ago Medicines:                Monitored Anesthesia Care Procedure:                Pre-Anesthesia Assessment:                           - Prior to the procedure, a History and Physical                            was performed, and patient medications and                            allergies were reviewed. The patient's tolerance of                            previous anesthesia was also reviewed. The risks                            and benefits of the procedure and the sedation                            options and risks were discussed with the patient.                            All questions were answered, and informed consent                            was obtained. Prior Anticoagulants: The patient has                            taken no previous anticoagulant or antiplatelet                            agents. ASA Grade Assessment: III - A patient with                            severe systemic disease. After reviewing the risks                            and benefits, the patient was deemed in                            satisfactory condition to undergo the procedure.  After obtaining informed consent, the colonoscope                            was passed under direct vision. Throughout the                            procedure, the patient's blood pressure, pulse, and                            oxygen saturations were monitored continuously. The                            Colonoscope was introduced through the anus and                            advanced to the  neo-terminal ileum. The terminal                            ileum and the rectum were photographed. The quality                            of the bowel preparation was good. The colonoscopy                            was performed without difficulty. The patient                            tolerated the procedure well. Scope In: 10:23:59 AM Scope Out: 10:36:38 AM Scope Withdrawal Time: 0 hours 11 minutes 12 seconds  Total Procedure Duration: 0 hours 12 minutes 39 seconds  Findings:                 The perianal and digital rectal examinations were                            normal.                           There was evidence of a prior end-to-end                            ileo-colonic anastomosis in the ascending colon.                            This was patent and was characterized by healthy                            appearing mucosa. The anastomosis was traversed.                           The neo-terminal ileum appeared normal.                           Three sessile polyps were found in the sigmoid  colon, descending colon and transverse colon. The                            polyps were 6 to 8 mm in size. These polyps were                            removed with a cold snare. Resection and retrieval                            were complete.                           A 5 mm polyp was found in the sigmoid colon. The                            polyp was sessile. The polyp was removed with a                            cold biopsy forceps. Resection and retrieval were                            complete.                           Multiple small-mouthed diverticula were found in                            the left colon. There was narrowing of the colon in                            association with the diverticular opening. There                            was evidence of an impacted diverticulum. There was                            no evidence of  diverticular bleeding.                           Internal hemorrhoids were found during                            retroflexion. The hemorrhoids were medium-sized and                            Grade I (internal hemorrhoids that do not prolapse).                           The exam was otherwise without abnormality on                            direct and retroflexion views. Complications:            No immediate complications. Estimated blood loss:  None. Estimated Blood Loss:     Estimated blood loss: none. Impression:               - Patent end-to-end ileo-colonic anastomosis,                            characterized by healthy appearing mucosa.                           - The examined portion of the ileum was normal.                           - Three 6 to 8 mm polyps in the sigmoid colon, in                            the descending colon and in the transverse colon,                            removed with a cold snare. Resected and retrieved.                           - One 5 mm polyp in the sigmoid colon, removed with                            a cold biopsy forceps. Resected and retrieved.                           - Internal hemorrhoids.                           - Moderate diverticulosis in the left colon. There                            was narrowing of the colon in association with the                            diverticular opening. There was evidence of an                            impacted diverticulum. There was no evidence of                            diverticular bleeding.                           - The examination was otherwise normal on direct                            and retroflexion views. Recommendation:           - Patient has a contact number available for                            emergencies. The signs and symptoms of potential  delayed complications were discussed with the                             patient. Return to normal activities tomorrow.                            Written discharge instructions were provided to the                            patient.                           - Resume previous diet.                           - Continue present medications.                           - Await pathology results.                           - No repeat colonoscopy due to age. Ladene Artist, MD 09/23/2017 10:42:59 AM This report has been signed electronically.

## 2017-09-23 NOTE — Progress Notes (Signed)
Report given to PACU, vss 

## 2017-09-23 NOTE — Patient Instructions (Addendum)
YOU HAD AN ENDOSCOPIC PROCEDURE TODAY AT [facility]:   Refer to the procedure report that was given to you for any specific questions about what was found during the examination.  If the procedure report does not answer your questions, please call your gastroenterologist to clarify.  If you requested that your care partner not be given the details of your procedure findings, then the procedure report has been included in a sealed envelope for you to review at your convenience later.  YOU SHOULD EXPECT: Some feelings of bloating in the abdomen. Passage of more gas than usual.  Walking can help get rid of the air that was put into your GI tract during the procedure and reduce the bloating. If you had a lower endoscopy (such as a colonoscopy or flexible sigmoidoscopy) you may notice spotting of blood in your stool or on the toilet paper. If you underwent a bowel prep for your procedure, you may not have a normal bowel movement for a few days.  Please Note:  You might notice some irritation and congestion in your nose or some drainage.  This is from the oxygen used during your procedure.  There is no need for concern and it should clear up in a day or so.  SYMPTOMS TO REPORT IMMEDIATELY:   Following lower endoscopy (colonoscopy or flexible sigmoidoscopy):  Excessive amounts of blood in the stool  Significant tenderness or worsening of abdominal pains  Swelling of the abdomen that is new, acute  Fever of 100F or higher    For urgent or emergent issues, a gastroenterologist can be reached at any hour by calling [phone].   DIET:  We do recommend a small meal at first, but then you may proceed to your regular diet.  Drink plenty of fluids but you should avoid alcoholic beverages for 24 hours.  ACTIVITY:  You should plan to take it easy for the rest of today and you should NOT DRIVE or use heavy machinery until tomorrow (because of the sedation medicines used during the test).     FOLLOW UP: Our staff will call the number listed on your records the next business day following your procedure to check on you and address any questions or concerns that you may have regarding the information given to you following your procedure. If we do not reach you, we will leave a message.  However, if you are feeling well and you are not experiencing any problems, there is no need to return our call.  We will assume that you have returned to your regular daily activities without incident.  If any biopsies were taken you will be contacted by phone or by letter within the next 1-3 weeks.  Please call us at [phone] if you have not heard about the biopsies in 3 weeks.    SIGNATURES/CONFIDENTIALITY: You and/or your care partner have signed paperwork which will be entered into your electronic medical record.  These signatures attest to the fact that that the information above on your After Visit Summary has been reviewed and is understood.  Full responsibility of the confidentiality of this discharge information lies with you and/or your care-partner.    Handouts were given to your care partner on polyps, diverticulosis, hemorrhoids, and a high fiber diet with liberal fluid intake. Your blood sugar was 114 in the recovery room. You may resume your current medications today. Await biopsy results. Please call if any questions or concerns.

## 2017-09-23 NOTE — Progress Notes (Signed)
Pt's states no medical or surgical changes since previsit or office visit. 

## 2017-09-23 NOTE — Progress Notes (Signed)
Called to room to assist during endoscopic procedure.  Patient ID and intended procedure confirmed with present staff. Received instructions for my participation in the procedure from the performing physician.  

## 2017-09-23 NOTE — Progress Notes (Signed)
Margie Ege, RN assisted pt with dressing.  He lives in at [facility].  No problems noted in the recovery room. maw

## 2017-09-24 ENCOUNTER — Telehealth: Payer: Self-pay | Admitting: *Deleted

## 2017-09-24 NOTE — Telephone Encounter (Signed)
  Follow up Call-  Call back number 09/23/2017  Post procedure Call Back phone  # [phone] where patient resides.  Permission to leave phone message Yes  Some recent data might be hidden     Patient questions:  Do you have a fever, pain , or abdominal swelling? No. Pain Score  0 *  Have you tolerated food without any problems? Yes.    Have you been able to return to your normal activities? Yes.    Do you have any questions about your discharge instructions: Diet   No. Medications  No. Follow up visit  No.  Do you have questions or concerns about your Care? No.  Actions: * If pain score is 4 or above: No action needed, pain <4.  Spoke with pt.nurse she stated" he is doing fine".

## 2017-10-03 ENCOUNTER — Encounter: Payer: Self-pay | Admitting: Gastroenterology

## 2017-10-06 NOTE — Progress Notes (Signed)
Letter sent 1/2

## 2018-09-14 ENCOUNTER — Emergency Department (HOSPITAL_COMMUNITY)
Admission: EM | Admit: 2018-09-14 | Discharge: 2018-09-14 | Disposition: A | Discharge status: Home / Self Care | Payer: Medicare Other | Attending: Emergency Medicine | Admitting: Emergency Medicine

## 2018-09-14 ENCOUNTER — Emergency Department (HOSPITAL_COMMUNITY): Payer: Medicare Other

## 2018-09-14 ENCOUNTER — Other Ambulatory Visit: Payer: Self-pay

## 2018-09-14 ENCOUNTER — Encounter (HOSPITAL_COMMUNITY): Payer: Self-pay | Admitting: Emergency Medicine

## 2018-09-14 DIAGNOSIS — Z79899 Other long term (current) drug therapy: Secondary | ICD-10-CM | POA: Insufficient documentation

## 2018-09-14 DIAGNOSIS — E119 Type 2 diabetes mellitus without complications: Secondary | ICD-10-CM | POA: Diagnosis not present

## 2018-09-14 DIAGNOSIS — Z7984 Long term (current) use of oral hypoglycemic drugs: Secondary | ICD-10-CM | POA: Insufficient documentation

## 2018-09-14 DIAGNOSIS — I10 Essential (primary) hypertension: Secondary | ICD-10-CM | POA: Insufficient documentation

## 2018-09-14 DIAGNOSIS — Z7982 Long term (current) use of aspirin: Secondary | ICD-10-CM | POA: Insufficient documentation

## 2018-09-14 DIAGNOSIS — M25552 Pain in left hip: Secondary | ICD-10-CM | POA: Diagnosis not present

## 2018-09-14 DIAGNOSIS — Z85038 Personal history of other malignant neoplasm of large intestine: Secondary | ICD-10-CM | POA: Insufficient documentation

## 2018-09-14 DIAGNOSIS — Z87891 Personal history of nicotine dependence: Secondary | ICD-10-CM | POA: Diagnosis not present

## 2018-09-14 MED ORDER — HYDROCODONE-ACETAMINOPHEN 5-325 MG PO TABS
1.0000 | ORAL_TABLET | Freq: Once | ORAL | Status: AC
  Administered 2018-09-14: 1 via ORAL
  Filled 2018-09-14: qty 1
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
## 2018-09-14 NOTE — Discharge Instructions (Signed)
You may alternate ibuprofen or tylenol for your left hip pain. You may also try ice to the area. Please follow up with PCP in 1 week or as needed.
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
## 2018-09-16 ENCOUNTER — Emergency Department (HOSPITAL_COMMUNITY): Payer: Medicare Other

## 2018-09-16 ENCOUNTER — Emergency Department (HOSPITAL_COMMUNITY)
Admission: EM | Admit: 2018-09-16 | Discharge: 2018-09-17 | Disposition: A | Discharge status: Home / Self Care | Payer: Medicare Other | Attending: Emergency Medicine | Admitting: Emergency Medicine

## 2018-09-16 ENCOUNTER — Encounter (HOSPITAL_COMMUNITY): Payer: Self-pay | Admitting: Emergency Medicine

## 2018-09-16 ENCOUNTER — Other Ambulatory Visit: Payer: Self-pay

## 2018-09-16 DIAGNOSIS — Z85038 Personal history of other malignant neoplasm of large intestine: Secondary | ICD-10-CM | POA: Diagnosis not present

## 2018-09-16 DIAGNOSIS — I10 Essential (primary) hypertension: Secondary | ICD-10-CM | POA: Diagnosis not present

## 2018-09-16 DIAGNOSIS — E119 Type 2 diabetes mellitus without complications: Secondary | ICD-10-CM | POA: Insufficient documentation

## 2018-09-16 DIAGNOSIS — F1099 Alcohol use, unspecified with unspecified alcohol-induced disorder: Secondary | ICD-10-CM | POA: Diagnosis not present

## 2018-09-16 DIAGNOSIS — Z87891 Personal history of nicotine dependence: Secondary | ICD-10-CM | POA: Insufficient documentation

## 2018-09-16 DIAGNOSIS — Z590 Homelessness: Secondary | ICD-10-CM | POA: Diagnosis not present

## 2018-09-16 DIAGNOSIS — E876 Hypokalemia: Secondary | ICD-10-CM | POA: Insufficient documentation

## 2018-09-16 DIAGNOSIS — M25552 Pain in left hip: Secondary | ICD-10-CM | POA: Diagnosis not present

## 2018-09-16 DIAGNOSIS — G8929 Other chronic pain: Secondary | ICD-10-CM | POA: Insufficient documentation

## 2018-09-16 MED ORDER — LIDOCAINE 5 % EX PTCH
1.0000 | MEDICATED_PATCH | CUTANEOUS | Status: DC
  Administered 2018-09-16: 1 via TRANSDERMAL
  Filled 2018-09-16: qty 1
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
## 2018-09-16 NOTE — ED Notes (Signed)
Patient transported to CT 

## 2018-09-16 NOTE — ED Triage Notes (Signed)
Patient is believed to be homeless, He complains of left hip, leg and foot pain. He fell today because he reports his leg gave out. Since the fall he now has right arm pain.

## 2018-09-16 NOTE — ED Notes (Signed)
Bed: WA07 Expected date:  Expected time:  Means of arrival:  Comments: 77 yr old hypertension

## 2018-09-17 DIAGNOSIS — M25552 Pain in left hip: Secondary | ICD-10-CM | POA: Diagnosis not present

## 2018-09-17 LAB — CBC
HEMATOCRIT: 43.9 % (ref 39.0–52.0)
Hemoglobin: 14.2 g/dL (ref 13.0–17.0)
MCH: 30.1 pg (ref 26.0–34.0)
MCHC: 32.3 g/dL (ref 30.0–36.0)
MCV: 93.2 fL (ref 80.0–100.0)
Platelets: 217 10*3/uL (ref 150–400)
RBC: 4.71 MIL/uL (ref 4.22–5.81)
RDW: 14.8 % (ref 11.5–15.5)
WBC: 5.3 10*3/uL (ref 4.0–10.5)
nRBC: 0 % (ref 0.0–0.2)

## 2018-09-17 LAB — URINALYSIS, ROUTINE W REFLEX MICROSCOPIC
Bacteria, UA: NONE SEEN
Bilirubin Urine: NEGATIVE
Glucose, UA: 50 mg/dL — AB
Ketones, ur: 5 mg/dL — AB
Leukocytes, UA: NEGATIVE
Nitrite: NEGATIVE
Protein, ur: NEGATIVE mg/dL
Specific Gravity, Urine: 1.013 (ref 1.005–1.030)
pH: 5 (ref 5.0–8.0)

## 2018-09-17 LAB — COMPREHENSIVE METABOLIC PANEL
ALT: 23 U/L (ref 0–44)
AST: 32 U/L (ref 15–41)
Albumin: 3.1 g/dL — ABNORMAL LOW (ref 3.5–5.0)
Alkaline Phosphatase: 77 U/L (ref 38–126)
Anion gap: 12 (ref 5–15)
BUN: 14 mg/dL (ref 8–23)
CO2: 25 mmol/L (ref 22–32)
CREATININE: 0.99 mg/dL (ref 0.61–1.24)
Calcium: 8.7 mg/dL — ABNORMAL LOW (ref 8.9–10.3)
Chloride: 103 mmol/L (ref 98–111)
GFR calc Af Amer: >60 mL/min (ref >60)
GFR calc non Af Amer: >60 mL/min (ref >60)
Glucose, Bld: 100 mg/dL — ABNORMAL HIGH (ref 70–99)
Potassium: 2.9 mmol/L — ABNORMAL LOW (ref 3.5–5.1)
Sodium: 140 mmol/L (ref 135–145)
Total Bilirubin: 0.6 mg/dL (ref 0.3–1.2)
Total Protein: 6.3 g/dL — ABNORMAL LOW (ref 6.5–8.1)

## 2018-09-17 MED ORDER — POTASSIUM CHLORIDE CRYS ER 20 MEQ PO TBCR
EXTENDED_RELEASE_TABLET | ORAL | Status: AC
  Filled 2018-09-17: qty 2

## 2018-09-17 MED ORDER — POTASSIUM CHLORIDE 10 MEQ/100ML IV SOLN
10.0000 meq | INTRAVENOUS | Status: AC
  Administered 2018-09-17: 10 meq via INTRAVENOUS
  Filled 2018-09-17: qty 100

## 2018-09-17 MED ORDER — POTASSIUM CHLORIDE CRYS ER 20 MEQ PO TBCR
40.0000 meq | EXTENDED_RELEASE_TABLET | Freq: Once | ORAL | Status: AC
  Administered 2018-09-17: 40 meq via ORAL

## 2018-09-17 MED ORDER — LIDOCAINE 5 % EX PTCH: 1.0000 | MEDICATED_PATCH | CUTANEOUS | 0 refills | Status: DC

## 2018-09-17 MED ORDER — POTASSIUM CHLORIDE 10 MEQ/100ML IV SOLN
INTRAVENOUS | Status: AC
  Filled 2018-09-17: qty 100

## 2018-09-17 MED ORDER — POTASSIUM CHLORIDE CRYS ER 20 MEQ PO TBCR: 20.0000 meq | EXTENDED_RELEASE_TABLET | Freq: Two times a day (BID) | ORAL | 0 refills | Status: DC
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
## 2018-09-17 NOTE — Care Management Note (Signed)
Case Management Note  CM consulted by CSW for DME cane.  Orders placed by Rona Ravens, PA.  Contacted Santiago Glad with [facility] who will deliver to bedside.  No further CM needs noted at this time.  Latrelle Fuston, Benjaman Lobe, RN 09/17/2018, 10:23 AM

## 2018-09-17 NOTE — ED Notes (Signed)
Three rounds of potassium were given during downtime

## 2018-09-17 NOTE — ED Notes (Signed)
Patient given discharge teaching and verbalized understanding. Patient ambulated out of ED with a steady gait. 

## 2018-09-17 NOTE — ED Notes (Signed)
Urine and culture sent to lab  

## 2018-09-17 NOTE — ED Notes (Signed)
*  SEE DOWNTIME FORMS FOR INFORMATION*

## 2018-09-17 NOTE — ED Notes (Addendum)
Patient will be discharged once social worker finishes consultation to determine where patient can pick up discharge medications.
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
## 2018-09-17 NOTE — ED Notes (Signed)
*  DOWNTIME COMPLETE*

## 2018-09-17 NOTE — Discharge Instructions (Addendum)
Thank you for allowing me to care for you today in the Emergency Department.   Your potassium was low today. Take one tablet of potassium chloride 2 times daily for the next 5 days.    Apply 1 lidocaine patch onto the skin to painful areas daily.  Do not apply more than 1 patch at a time.

## 2018-12-03 ENCOUNTER — Other Ambulatory Visit: Payer: Self-pay

## 2018-12-03 ENCOUNTER — Emergency Department (HOSPITAL_COMMUNITY)
Admission: EM | Admit: 2018-12-03 | Discharge: 2018-12-03 | Disposition: A | Discharge status: Home / Self Care | Payer: Medicare Other | Attending: Emergency Medicine | Admitting: Emergency Medicine

## 2018-12-03 ENCOUNTER — Emergency Department (HOSPITAL_COMMUNITY): Payer: Medicare Other

## 2018-12-03 ENCOUNTER — Encounter (HOSPITAL_COMMUNITY): Payer: Self-pay | Admitting: Emergency Medicine

## 2018-12-03 DIAGNOSIS — I10 Essential (primary) hypertension: Secondary | ICD-10-CM | POA: Diagnosis not present

## 2018-12-03 DIAGNOSIS — Z79899 Other long term (current) drug therapy: Secondary | ICD-10-CM | POA: Diagnosis not present

## 2018-12-03 DIAGNOSIS — Z85038 Personal history of other malignant neoplasm of large intestine: Secondary | ICD-10-CM | POA: Diagnosis not present

## 2018-12-03 DIAGNOSIS — Z87891 Personal history of nicotine dependence: Secondary | ICD-10-CM | POA: Diagnosis not present

## 2018-12-03 DIAGNOSIS — G8929 Other chronic pain: Secondary | ICD-10-CM

## 2018-12-03 DIAGNOSIS — M25552 Pain in left hip: Secondary | ICD-10-CM | POA: Diagnosis present

## 2018-12-03 DIAGNOSIS — E119 Type 2 diabetes mellitus without complications: Secondary | ICD-10-CM | POA: Diagnosis not present

## 2018-12-03 DIAGNOSIS — J209 Acute bronchitis, unspecified: Secondary | ICD-10-CM

## 2018-12-03 MED ORDER — ALBUTEROL SULFATE HFA 108 (90 BASE) MCG/ACT IN AERS: 2.0000 | INHALATION_SPRAY | RESPIRATORY_TRACT | 0 refills | Status: DC | PRN

## 2018-12-03 MED ORDER — HYDROCODONE-ACETAMINOPHEN 5-325 MG PO TABS
1.0000 | ORAL_TABLET | Freq: Once | ORAL | Status: AC
  Administered 2018-12-03: 1 via ORAL
  Filled 2018-12-03: qty 1

## 2018-12-03 MED ORDER — IBUPROFEN 400 MG PO TABS
600.0000 mg | ORAL_TABLET | Freq: Once | ORAL | Status: AC
  Administered 2018-12-03: 600 mg via ORAL
  Filled 2018-12-03: qty 1

## 2018-12-03 MED ORDER — DOXYCYCLINE HYCLATE 100 MG PO CAPS: 100.0000 mg | ORAL_CAPSULE | Freq: Two times a day (BID) | ORAL | 0 refills | Status: DC
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
## 2018-12-31 ENCOUNTER — Encounter (HOSPITAL_COMMUNITY): Payer: Self-pay | Admitting: Emergency Medicine

## 2018-12-31 ENCOUNTER — Other Ambulatory Visit: Payer: Self-pay

## 2018-12-31 ENCOUNTER — Emergency Department (HOSPITAL_COMMUNITY)
Admission: EM | Admit: 2018-12-31 | Discharge: 2018-12-31 | Disposition: A | Discharge status: Home / Self Care | Payer: Medicare Other | Attending: Emergency Medicine | Admitting: Emergency Medicine

## 2018-12-31 DIAGNOSIS — E119 Type 2 diabetes mellitus without complications: Secondary | ICD-10-CM | POA: Insufficient documentation

## 2018-12-31 DIAGNOSIS — Z79899 Other long term (current) drug therapy: Secondary | ICD-10-CM | POA: Insufficient documentation

## 2018-12-31 DIAGNOSIS — Z87891 Personal history of nicotine dependence: Secondary | ICD-10-CM | POA: Insufficient documentation

## 2018-12-31 DIAGNOSIS — I10 Essential (primary) hypertension: Secondary | ICD-10-CM | POA: Diagnosis not present

## 2018-12-31 LAB — CBC WITH DIFFERENTIAL/PLATELET
Abs Immature Granulocytes: 0.01 10*3/uL (ref 0.00–0.07)
Basophils Absolute: 0.1 10*3/uL (ref 0.0–0.1)
Basophils Relative: 1 %
Eosinophils Absolute: 0.5 10*3/uL (ref 0.0–0.5)
Eosinophils Relative: 9 %
HCT: 46.8 % (ref 39.0–52.0)
Hemoglobin: 14.8 g/dL (ref 13.0–17.0)
Immature Granulocytes: 0 %
LYMPHS PCT: 18 %
Lymphs Abs: 0.9 10*3/uL (ref 0.7–4.0)
MCH: 29 pg (ref 26.0–34.0)
MCHC: 31.6 g/dL (ref 30.0–36.0)
MCV: 91.6 fL (ref 80.0–100.0)
MONO ABS: 0.5 10*3/uL (ref 0.1–1.0)
Monocytes Relative: 10 %
NRBC: 0 % (ref 0.0–0.2)
Neutro Abs: 3.2 10*3/uL (ref 1.7–7.7)
Neutrophils Relative %: 62 %
Platelets: 217 10*3/uL (ref 150–400)
RBC: 5.11 MIL/uL (ref 4.22–5.81)
RDW: 14.8 % (ref 11.5–15.5)
WBC: 5.2 10*3/uL (ref 4.0–10.5)

## 2018-12-31 LAB — COMPREHENSIVE METABOLIC PANEL
ALT: 23 U/L (ref 0–44)
AST: 28 U/L (ref 15–41)
Albumin: 3.2 g/dL — ABNORMAL LOW (ref 3.5–5.0)
Alkaline Phosphatase: 97 U/L (ref 38–126)
Anion gap: 7 (ref 5–15)
BILIRUBIN TOTAL: 0.7 mg/dL (ref 0.3–1.2)
BUN: 15 mg/dL (ref 8–23)
CO2: 26 mmol/L (ref 22–32)
CREATININE: 1.69 mg/dL — AB (ref 0.61–1.24)
Calcium: 8.8 mg/dL — ABNORMAL LOW (ref 8.9–10.3)
Chloride: 102 mmol/L (ref 98–111)
GFR calc Af Amer: 44 mL/min — ABNORMAL LOW (ref >60)
GFR calc non Af Amer: 38 mL/min — ABNORMAL LOW (ref >60)
Glucose, Bld: 162 mg/dL — ABNORMAL HIGH (ref 70–99)
Potassium: 3.7 mmol/L (ref 3.5–5.1)
Sodium: 135 mmol/L (ref 135–145)
Total Protein: 7 g/dL (ref 6.5–8.1)

## 2018-12-31 LAB — URINALYSIS, ROUTINE W REFLEX MICROSCOPIC
Bilirubin Urine: NEGATIVE
Glucose, UA: 50 mg/dL — AB
HGB URINE DIPSTICK: NEGATIVE
Ketones, ur: NEGATIVE mg/dL
Leukocytes,Ua: NEGATIVE
Nitrite: NEGATIVE
Protein, ur: NEGATIVE mg/dL
Specific Gravity, Urine: 1.018 (ref 1.005–1.030)
pH: 5 (ref 5.0–8.0)

## 2018-12-31 NOTE — ED Triage Notes (Signed)
Pt arrives via POV with reports of HTN while at the dentist office. States he takes a pill every morning but does not have a way to check his BP at home. Denies any CP or SOB.
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
## 2018-12-31 NOTE — Discharge Instructions (Addendum)
You have been seen today for high blood pressure. Please read and follow all provided instructions.   1. Medications: usual home medications 2. Treatment: rest, drink plenty of fluids, eat a low salt healthy diet, exercise 3. Follow Up: Please follow up with your primary doctor in 2-5 days for discussion of your diagnoses and further evaluation after today's visit; if you do not have a primary care doctor use the resource guide provided to find one; Please return to the ER for any new or worsening symptoms. Please obtain all of your results from medical records or have your doctors office obtain the results - share them with your doctor - you should be seen at your doctors office. Call today to arrange your follow up.   Take medications as prescribed. Please review all of the medicines and only take them if you do not have an allergy to them. Return to the emergency room for worsening condition or new concerning symptoms. Follow up with your regular doctor. If you don't have a regular doctor use one of the numbers below to establish a primary care doctor.  Please be aware that if you are taking birth control pills, taking other prescriptions, ESPECIALLY ANTIBIOTICS may make the birth control ineffective - if this is the case, either do not engage in sexual activity or use alternative methods of birth control such as condoms until you have finished the medicine and your family doctor says it is OK to restart them. If you are on a blood thinner such as COUMADIN, be aware that any other medicine that you take may cause the coumadin to either work too much, or not enough - you should have your coumadin level rechecked in next 7 days if this is the case.  ?  It is also a possibility that you have an allergic reaction to any of the medicines that you have been prescribed - Everybody reacts differently to medications and while MOST people have no trouble with most medicines, you may have a reaction such as  nausea, vomiting, rash, swelling, shortness of breath. If this is the case, please stop taking the medicine immediately and contact your physician.  ?  You should return to the ER if you develop severe or worsening symptoms.   Emergency Department Resource Guide 1) Find a Doctor and Pay Out of Pocket Although you won't have to find out who is covered by your insurance plan, it is a good idea to ask around and get recommendations. You will then need to call the office and see if the doctor you have chosen will accept you as a new patient and what types of options they offer for patients who are self-pay. Some doctors offer discounts or will set up payment plans for their patients who do not have insurance, but you will need to ask so you aren't surprised when you get to your appointment.  2) Contact Your Local Health Department Not all health departments have doctors that can see patients for sick visits, but many do, so it is worth a call to see if yours does. If you don't know where your local health department is, you can check in your phone book. The CDC also has a tool to help you locate your state's health department, and many state websites also have listings of all of their local health departments.  3) Find a [place] [facility] If your illness is not likely to be very severe or complicated, you may want to try a walk  in clinic. These are popping up all over the country in pharmacies, drugstores, and shopping centers. They're usually staffed by nurse practitioners or physician assistants that have been trained to treat common illnesses and complaints. They're usually fairly quick and inexpensive. However, if you have serious medical issues or chronic medical problems, these are probably not your best option.  No Primary Care Doctor: Call Health Connect at  [phone] - they can help you locate a primary care doctor that  accepts your insurance, provides certain services, etc. Physician Referral  Service- [phone]  Chronic Pain Problems: Organization         Address  Phone   Notes  [facility]  [phone] Patients need to be referred by their primary care doctor.   Medication Assistance: Organization         Address  Phone   Notes  [facility] Medication [facility] [address] [place], [place] [address] [phone] --Must be a resident of [facility] -- Must have NO insurance coverage whatsoever (no Medicaid/ Medicare, etc.) -- The pt. MUST have a primary care doctor that directs their care regularly and follows them in the community   [facility]  [phone]   [employer]  [phone]    Agencies that provide inexpensive medical care: Organization         Address  Phone   Notes  [place]  [phone]   [facility]    [phone]   [facility] [place], [place] [address] [phone]   [place] [address], [place] [phone]   [facility]    [phone]   [facility]    [phone]   [place] [address], [place] Phone:  [phone], Fax:  [phone] Hours of Operation:  9 am - 6 pm, M-F.  Also accepts Medicaid/Medicare and self-pay.  [facility] for [place], [address], [place] Phone: [phone], Fax: [phone]. Hours of Operation:  8:30 am - 5:30 pm, M-F.  Also accepts Medicaid and self-pay.  [facility] [address] [place] Phone: [phone]   [place], [place], [place] [phone], Ext. [phone] Mondays & Thursdays: 7-9 AM.  First 15 patients are seen on a first come, first serve basis.    [place] Providers:  Organization         Address  Phone   Notes  [facility] [address], [place] [phone] Also accepts self-pay patients.  [facility] [address], [place]  [phone]   [place], [address], [place] [phone]   [facility] [address] [place] [phone]   Lucianne Lei [address], [place]   [phone] Only accepts [place] patients after they have their name applied to their card.   Self-Pay (no insurance) in [facility]:  Organization         Address  Phone   Notes  Sickle Cell Patients, [facility] [place] [phone]   [facility] [place] [phone]   [facility]  [place]  [address], [place] [phone]   [facility]/Dr. Osei-Bonsu  [address] [place] or [address], [place] [phone] Phone number for both [place] and [place] locations is the same.  [facility] [address], [place] [phone]   [facility] [address] [place] or [address] [phone]   [facility] [address], [place] [phone], phone; [phone], fax Sees patients 1st and 3rd Saturday of every month.  Must not qualify for public or private insurance (i.e. Medicaid, Medicare, [place] [facility] Choice, Veterans' Benefits)  Household income should be no more than 200% of the poverty level The clinic cannot treat you if you are pregnant or think you are pregnant  Sexually transmitted diseases are not treated at the clinic.

## 2019-03-24 ENCOUNTER — Other Ambulatory Visit: Payer: Self-pay

## 2019-03-24 ENCOUNTER — Emergency Department (HOSPITAL_COMMUNITY)
Admission: EM | Admit: 2019-03-24 | Discharge: 2019-03-24 | Disposition: A | Discharge status: Home / Self Care | Payer: Medicare Other | Attending: Emergency Medicine | Admitting: Emergency Medicine

## 2019-03-24 DIAGNOSIS — Z79899 Other long term (current) drug therapy: Secondary | ICD-10-CM | POA: Diagnosis not present

## 2019-03-24 DIAGNOSIS — Z87891 Personal history of nicotine dependence: Secondary | ICD-10-CM | POA: Diagnosis not present

## 2019-03-24 DIAGNOSIS — Z85038 Personal history of other malignant neoplasm of large intestine: Secondary | ICD-10-CM | POA: Insufficient documentation

## 2019-03-24 DIAGNOSIS — I10 Essential (primary) hypertension: Secondary | ICD-10-CM | POA: Diagnosis present

## 2019-03-24 DIAGNOSIS — E119 Type 2 diabetes mellitus without complications: Secondary | ICD-10-CM | POA: Diagnosis not present

## 2019-03-24 LAB — CBC WITH DIFFERENTIAL/PLATELET
Abs Immature Granulocytes: 0.01 10*3/uL (ref 0.00–0.07)
Basophils Absolute: 0 10*3/uL (ref 0.0–0.1)
Basophils Relative: 1 %
Eosinophils Absolute: 0.3 10*3/uL (ref 0.0–0.5)
Eosinophils Relative: 6 %
HCT: 47.2 % (ref 39.0–52.0)
Hemoglobin: 15.7 g/dL (ref 13.0–17.0)
Immature Granulocytes: 0 %
Lymphocytes Relative: 21 %
Lymphs Abs: 1.1 10*3/uL (ref 0.7–4.0)
MCH: 30.3 pg (ref 26.0–34.0)
MCHC: 33.3 g/dL (ref 30.0–36.0)
MCV: 90.9 fL (ref 80.0–100.0)
Monocytes Absolute: 0.7 10*3/uL (ref 0.1–1.0)
Monocytes Relative: 14 %
Neutro Abs: 3 10*3/uL (ref 1.7–7.7)
Neutrophils Relative %: 58 %
Platelets: 256 10*3/uL (ref 150–400)
RBC: 5.19 MIL/uL (ref 4.22–5.81)
RDW: 14.1 % (ref 11.5–15.5)
WBC: 5.1 10*3/uL (ref 4.0–10.5)
nRBC: 0 % (ref 0.0–0.2)

## 2019-03-24 LAB — BASIC METABOLIC PANEL
Anion gap: 8 (ref 5–15)
BUN: 18 mg/dL (ref 8–23)
CO2: 26 mmol/L (ref 22–32)
Calcium: 9 mg/dL (ref 8.9–10.3)
Chloride: 101 mmol/L (ref 98–111)
Creatinine, Ser: 1.17 mg/dL (ref 0.61–1.24)
GFR calc Af Amer: >60 mL/min (ref >60)
GFR calc non Af Amer: 60 mL/min — ABNORMAL LOW (ref >60)
Glucose, Bld: 121 mg/dL — ABNORMAL HIGH (ref 70–99)
Potassium: 3.8 mmol/L (ref 3.5–5.1)
Sodium: 135 mmol/L (ref 135–145)

## 2019-03-24 MED ORDER — HYDROCHLOROTHIAZIDE 12.5 MG PO CAPS: 12.5000 mg | ORAL_CAPSULE | Freq: Every day | ORAL | 1 refills | Status: DC

## 2019-03-24 MED ORDER — LISINOPRIL 40 MG PO TABS: 40.0000 mg | ORAL_TABLET | Freq: Every day | ORAL | 1 refills | Status: DC

## 2019-03-24 MED ORDER — POTASSIUM CHLORIDE ER 10 MEQ PO TBCR: 10.0000 meq | EXTENDED_RELEASE_TABLET | Freq: Every day | ORAL | 1 refills | Status: DC

## 2019-03-24 NOTE — ED Notes (Signed)
Pt refuses to stay for any further care, but requests pain shot.  States he is wasting is time and is ready for DC.  BP meds refilled.  Pt stable on DC

## 2019-03-24 NOTE — ED Notes (Signed)
This RN acting as Art therapist and asked pt if he would like for me to contact any family/friends for him. Pt declined and states he is able to keep them informed.

## 2019-03-24 NOTE — ED Triage Notes (Signed)
Pt seen at dentist yesterday and was told he had BP and returned to the dentist again and she sent him to the ER.  Denies any pain, dizziness or SOB.  Denies having any sx.

## 2019-03-24 NOTE — ED Triage Notes (Signed)
States he does take his BP med every day and is needing refills.

## 2019-03-24 NOTE — Discharge Instructions (Addendum)
We sent refill for your lisinopril to your pharmacy and are starting you on a new medicine, diuretic called hydrochlorothiazide, for blood pressure.  Also we are prescribing potassium since the hydrochlorothiazide can make your potassium drop.  It is helpful to eat foods which contain a lot of potassium like bananas, or cabbage.  Make sure you follow-up with your primary care doctor for checkup in 1 or 2 weeks.
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
## 2019-04-04 ENCOUNTER — Emergency Department (HOSPITAL_COMMUNITY): Payer: Medicare Other

## 2019-04-04 ENCOUNTER — Other Ambulatory Visit: Payer: Self-pay

## 2019-04-04 ENCOUNTER — Emergency Department (HOSPITAL_COMMUNITY)
Admission: EM | Admit: 2019-04-04 | Discharge: 2019-04-04 | Disposition: A | Discharge status: Home / Self Care | Payer: Medicare Other | Attending: Emergency Medicine | Admitting: Emergency Medicine

## 2019-04-04 DIAGNOSIS — S0240EA Zygomatic fracture, right side, initial encounter for closed fracture: Secondary | ICD-10-CM | POA: Diagnosis not present

## 2019-04-04 DIAGNOSIS — S0191XA Laceration without foreign body of unspecified part of head, initial encounter: Secondary | ICD-10-CM

## 2019-04-04 DIAGNOSIS — S01511A Laceration without foreign body of lip, initial encounter: Secondary | ICD-10-CM | POA: Diagnosis not present

## 2019-04-04 DIAGNOSIS — Y999 Unspecified external cause status: Secondary | ICD-10-CM | POA: Insufficient documentation

## 2019-04-04 DIAGNOSIS — S025XXA Fracture of tooth (traumatic), initial encounter for closed fracture: Secondary | ICD-10-CM | POA: Diagnosis not present

## 2019-04-04 DIAGNOSIS — S20311A Abrasion of right front wall of thorax, initial encounter: Secondary | ICD-10-CM | POA: Insufficient documentation

## 2019-04-04 DIAGNOSIS — S0181XA Laceration without foreign body of other part of head, initial encounter: Secondary | ICD-10-CM | POA: Insufficient documentation

## 2019-04-04 DIAGNOSIS — Z23 Encounter for immunization: Secondary | ICD-10-CM | POA: Insufficient documentation

## 2019-04-04 DIAGNOSIS — Z79899 Other long term (current) drug therapy: Secondary | ICD-10-CM | POA: Insufficient documentation

## 2019-04-04 DIAGNOSIS — Y92521 Bus station as the place of occurrence of the external cause: Secondary | ICD-10-CM | POA: Insufficient documentation

## 2019-04-04 DIAGNOSIS — S0101XA Laceration without foreign body of scalp, initial encounter: Secondary | ICD-10-CM | POA: Diagnosis present

## 2019-04-04 DIAGNOSIS — Y939 Activity, unspecified: Secondary | ICD-10-CM | POA: Insufficient documentation

## 2019-04-04 DIAGNOSIS — T07XXXA Unspecified multiple injuries, initial encounter: Secondary | ICD-10-CM

## 2019-04-04 LAB — COMPREHENSIVE METABOLIC PANEL
ALT: 23 U/L (ref 0–44)
AST: 35 U/L (ref 15–41)
Albumin: 2.9 g/dL — ABNORMAL LOW (ref 3.5–5.0)
Alkaline Phosphatase: 108 U/L (ref 38–126)
Anion gap: 11 (ref 5–15)
BUN: 17 mg/dL (ref 8–23)
CO2: 20 mmol/L — ABNORMAL LOW (ref 22–32)
Calcium: 9 mg/dL (ref 8.9–10.3)
Chloride: 107 mmol/L (ref 98–111)
Creatinine, Ser: 1.42 mg/dL — ABNORMAL HIGH (ref 0.61–1.24)
GFR calc Af Amer: 55 mL/min — ABNORMAL LOW (ref >60)
GFR calc non Af Amer: 47 mL/min — ABNORMAL LOW (ref >60)
Glucose, Bld: 179 mg/dL — ABNORMAL HIGH (ref 70–99)
Potassium: 3.5 mmol/L (ref 3.5–5.1)
Sodium: 138 mmol/L (ref 135–145)
Total Bilirubin: 0.4 mg/dL (ref 0.3–1.2)
Total Protein: 6.3 g/dL — ABNORMAL LOW (ref 6.5–8.1)

## 2019-04-04 LAB — CBC
HCT: 50.4 % (ref 39.0–52.0)
Hemoglobin: 16.3 g/dL (ref 13.0–17.0)
MCH: 30.4 pg (ref 26.0–34.0)
MCHC: 32.3 g/dL (ref 30.0–36.0)
MCV: 93.9 fL (ref 80.0–100.0)
Platelets: 261 10*3/uL (ref 150–400)
RBC: 5.37 MIL/uL (ref 4.22–5.81)
RDW: 14 % (ref 11.5–15.5)
WBC: 6.6 10*3/uL (ref 4.0–10.5)
nRBC: 0 % (ref 0.0–0.2)

## 2019-04-04 LAB — BPAM FFP
Blood Product Expiration Date: 202007022359
Blood Product Expiration Date: 202007022359
ISSUE DATE / TIME: 202006280940
ISSUE DATE / TIME: 202006280940
Unit Type and Rh: 600
Unit Type and Rh: 6200

## 2019-04-04 LAB — TYPE AND SCREEN
ABO/RH(D): O POS
Antibody Screen: NEGATIVE
Unit division: 0
Unit division: 0

## 2019-04-04 LAB — I-STAT CHEM 8, ED
BUN: 19 mg/dL (ref 8–23)
Calcium, Ion: 1.16 mmol/L (ref 1.15–1.40)
Chloride: 106 mmol/L (ref 98–111)
Creatinine, Ser: 1.4 mg/dL — ABNORMAL HIGH (ref 0.61–1.24)
Glucose, Bld: 173 mg/dL — ABNORMAL HIGH (ref 70–99)
HCT: 51 % (ref 39.0–52.0)
Hemoglobin: 17.3 g/dL — ABNORMAL HIGH (ref 13.0–17.0)
Potassium: 3.5 mmol/L (ref 3.5–5.1)
Sodium: 140 mmol/L (ref 135–145)
TCO2: 22 mmol/L (ref 22–32)

## 2019-04-04 LAB — BPAM RBC
Blood Product Expiration Date: 202007022359
Blood Product Expiration Date: 202007032359
ISSUE DATE / TIME: 202006280940
ISSUE DATE / TIME: 202006280940
Unit Type and Rh: 9500
Unit Type and Rh: 9500

## 2019-04-04 LAB — PREPARE FRESH FROZEN PLASMA
Unit division: 0
Unit division: 0

## 2019-04-04 LAB — URINALYSIS, ROUTINE W REFLEX MICROSCOPIC
Bilirubin Urine: NEGATIVE
Glucose, UA: 50 mg/dL — AB
Ketones, ur: NEGATIVE mg/dL
Leukocytes,Ua: NEGATIVE
Nitrite: NEGATIVE
Protein, ur: NEGATIVE mg/dL
Specific Gravity, Urine: 1.014 (ref 1.005–1.030)
pH: 5 (ref 5.0–8.0)

## 2019-04-04 LAB — PROTIME-INR
INR: 1.1 (ref 0.8–1.2)
Prothrombin Time: 13.9 seconds (ref 11.4–15.2)

## 2019-04-04 LAB — LACTIC ACID, PLASMA: Lactic Acid, Venous: 3.9 mmol/L (ref 0.5–1.9)

## 2019-04-04 LAB — ABO/RH: ABO/RH(D): O POS

## 2019-04-04 LAB — CDS SEROLOGY

## 2019-04-04 LAB — ETHANOL: Alcohol, Ethyl (B): 27 mg/dL — ABNORMAL HIGH (ref <10)

## 2019-04-04 MED ORDER — HYDROCODONE-ACETAMINOPHEN 5-325 MG PO TABS
2.0000 | ORAL_TABLET | Freq: Once | ORAL | Status: AC
  Administered 2019-04-04: 2 via ORAL
  Filled 2019-04-04: qty 2

## 2019-04-04 MED ORDER — LIDOCAINE-EPINEPHRINE (PF) 2 %-1:200000 IJ SOLN
20.0000 mL | Freq: Once | INTRAMUSCULAR | Status: AC
  Administered 2019-04-04: 20 mL via INTRADERMAL
  Filled 2019-04-04: qty 20

## 2019-04-04 MED ORDER — LIDOCAINE-EPINEPHRINE-TETRACAINE (LET) SOLUTION
3.0000 mL | Freq: Once | NASAL | Status: AC
  Administered 2019-04-04: 3 mL via TOPICAL
  Filled 2019-04-04: qty 3

## 2019-04-04 MED ORDER — TETANUS-DIPHTH-ACELL PERTUSSIS 5-2.5-18.5 LF-MCG/0.5 IM SUSP
0.5000 mL | Freq: Once | INTRAMUSCULAR | Status: AC
  Administered 2019-04-04: 0.5 mL via INTRAMUSCULAR
  Filled 2019-04-04: qty 0.5

## 2019-04-04 MED ORDER — SODIUM CHLORIDE 0.9 % IV BOLUS
1000.0000 mL | Freq: Once | INTRAVENOUS | Status: AC
  Administered 2019-04-04: 1000 mL via INTRAVENOUS
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
## 2019-04-04 NOTE — ED Notes (Signed)
Returned from CT.

## 2019-04-04 NOTE — ED Notes (Signed)
Pt escorted to car picked up by nephew. DC instructions communicated to nephew as well. Pt steady on feet getting in car.

## 2019-04-04 NOTE — Progress Notes (Signed)
Orthopedic Tech Progress Note Patient Details:  Caleb Andrews 02-Apr-1941 [id number]  Ortho Devices Ortho Device/Splint Location: Level one Trauma       [name] 04/04/2019, 10:14 AM

## 2019-04-04 NOTE — ED Notes (Signed)
Nephew- bernard, would like an update at [phone]
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
## 2019-04-04 NOTE — ED Notes (Signed)
opthalmology at bedside.

## 2019-04-04 NOTE — ED Notes (Signed)
Ilona Sorrel nephew will pick up patient in 10 min.

## 2019-04-04 NOTE — ED Notes (Signed)
Transported to CT with RN  

## 2019-04-04 NOTE — ED Provider Notes (Signed)
..  Laceration Repair  Date/Time: 04/04/2019 12:16 PM Performed by: Margarita Mail, PA-C Authorized by: Margarita Mail, PA-C   Consent:    Consent obtained:  Verbal   Consent given by:  Patient   Risks discussed:  Infection, need for additional repair, pain, poor cosmetic result and poor wound healing   Alternatives discussed:  No treatment and delayed treatment Universal protocol:    Procedure explained and questions answered to patient or proxy's satisfaction: yes     Relevant documents present and verified: yes     Test results available and properly labeled: yes     Imaging studies available: yes     Required blood products, implants, devices, and special equipment available: yes     Site/side marked: yes     Immediately prior to procedure, a time out was called: yes     Patient identity confirmed:  Verbally with patient Anesthesia (see MAR for exact dosages):    Anesthesia method:  Local infiltration   Local anesthetic:  Lidocaine 1% WITH epi Laceration details:    Location:  Scalp   Scalp location:  Frontal   Length (cm):  7   Depth (mm):  10 Repair type:    Repair type:  Simple Pre-procedure details:    Preparation:  Patient was prepped and draped in usual sterile fashion Exploration:    Wound exploration: wound explored through full range of motion and entire depth of wound probed and visualized   Treatment:    Area cleansed with:  Saline   Amount of cleaning:  Standard   Irrigation solution:  Sterile saline Skin repair:    Repair method:  Staples   Number of staples:  5 Approximation:    Approximation:  Close Post-procedure details:    Dressing:  Open (no dressing)       Margarita Mail, PA-C 04/04/19 1216    Little, Wenda Overland, MD 04/05/19 1240
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
## 2019-04-04 NOTE — ED Notes (Signed)
Pt niece Jodi Geralds [phone]

## 2019-04-30 ENCOUNTER — Other Ambulatory Visit: Payer: Self-pay

## 2019-04-30 DIAGNOSIS — Z20822 Contact with and (suspected) exposure to covid-19: Secondary | ICD-10-CM

## 2019-05-03 LAB — NOVEL CORONAVIRUS, NAA: SARS-CoV-2, NAA: NOT DETECTED

## 2019-06-28 ENCOUNTER — Other Ambulatory Visit: Payer: Self-pay

## 2019-06-28 ENCOUNTER — Emergency Department (HOSPITAL_COMMUNITY)
Admission: EM | Admit: 2019-06-28 | Discharge: 2019-06-28 | Disposition: A | Discharge status: Home / Self Care | Payer: Medicare Other | Attending: Emergency Medicine | Admitting: Emergency Medicine

## 2019-06-28 DIAGNOSIS — I10 Essential (primary) hypertension: Secondary | ICD-10-CM | POA: Diagnosis not present

## 2019-06-28 DIAGNOSIS — Z85038 Personal history of other malignant neoplasm of large intestine: Secondary | ICD-10-CM | POA: Diagnosis not present

## 2019-06-28 DIAGNOSIS — E119 Type 2 diabetes mellitus without complications: Secondary | ICD-10-CM | POA: Insufficient documentation

## 2019-06-28 DIAGNOSIS — F10920 Alcohol use, unspecified with intoxication, uncomplicated: Secondary | ICD-10-CM | POA: Diagnosis present

## 2019-06-28 DIAGNOSIS — Z79899 Other long term (current) drug therapy: Secondary | ICD-10-CM | POA: Diagnosis not present

## 2019-06-28 DIAGNOSIS — Z87891 Personal history of nicotine dependence: Secondary | ICD-10-CM | POA: Diagnosis not present

## 2019-06-28 LAB — CBG MONITORING, ED: Glucose-Capillary: 108 mg/dL — ABNORMAL HIGH (ref 70–99)
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
## 2019-08-27 ENCOUNTER — Other Ambulatory Visit: Payer: Self-pay

## 2019-08-27 DIAGNOSIS — Z20822 Contact with and (suspected) exposure to covid-19: Secondary | ICD-10-CM

## 2019-08-27 NOTE — Progress Notes (Signed)
novel 

## 2019-08-30 LAB — NOVEL CORONAVIRUS, NAA: SARS-CoV-2, NAA: NOT DETECTED

## 2019-09-24 DIAGNOSIS — Z590 Homelessness: Secondary | ICD-10-CM | POA: Diagnosis not present

## 2019-09-24 DIAGNOSIS — I10 Essential (primary) hypertension: Secondary | ICD-10-CM | POA: Diagnosis not present

## 2019-09-24 DIAGNOSIS — Z87891 Personal history of nicotine dependence: Secondary | ICD-10-CM | POA: Diagnosis not present

## 2019-09-24 DIAGNOSIS — R55 Syncope and collapse: Secondary | ICD-10-CM | POA: Insufficient documentation

## 2019-09-24 DIAGNOSIS — Z79899 Other long term (current) drug therapy: Secondary | ICD-10-CM | POA: Diagnosis not present

## 2019-09-24 DIAGNOSIS — M25552 Pain in left hip: Secondary | ICD-10-CM | POA: Diagnosis present

## 2019-09-24 DIAGNOSIS — Z85038 Personal history of other malignant neoplasm of large intestine: Secondary | ICD-10-CM | POA: Insufficient documentation

## 2019-09-24 DIAGNOSIS — F10220 Alcohol dependence with intoxication, uncomplicated: Secondary | ICD-10-CM | POA: Diagnosis not present

## 2019-09-24 DIAGNOSIS — E119 Type 2 diabetes mellitus without complications: Secondary | ICD-10-CM | POA: Diagnosis not present

## 2019-09-25 ENCOUNTER — Emergency Department (HOSPITAL_COMMUNITY)
Admission: EM | Admit: 2019-09-25 | Discharge: 2019-09-25 | Disposition: A | Discharge status: Home / Self Care | Payer: Medicare Other | Attending: Emergency Medicine | Admitting: Emergency Medicine

## 2019-09-25 ENCOUNTER — Emergency Department (HOSPITAL_COMMUNITY): Payer: Medicare Other

## 2019-09-25 ENCOUNTER — Other Ambulatory Visit: Payer: Self-pay

## 2019-09-25 ENCOUNTER — Emergency Department (HOSPITAL_COMMUNITY)
Admission: EM | Admit: 2019-09-25 | Discharge: 2019-09-26 | Disposition: A | Discharge status: Home / Self Care | Payer: Medicare Other | Source: Home / Self Care | Attending: Emergency Medicine | Admitting: Emergency Medicine

## 2019-09-25 ENCOUNTER — Encounter (HOSPITAL_COMMUNITY): Payer: Self-pay | Admitting: Emergency Medicine

## 2019-09-25 ENCOUNTER — Encounter (HOSPITAL_COMMUNITY): Payer: Self-pay

## 2019-09-25 DIAGNOSIS — F10220 Alcohol dependence with intoxication, uncomplicated: Secondary | ICD-10-CM | POA: Insufficient documentation

## 2019-09-25 DIAGNOSIS — M25552 Pain in left hip: Secondary | ICD-10-CM | POA: Diagnosis not present

## 2019-09-25 DIAGNOSIS — E119 Type 2 diabetes mellitus without complications: Secondary | ICD-10-CM | POA: Insufficient documentation

## 2019-09-25 DIAGNOSIS — M25559 Pain in unspecified hip: Secondary | ICD-10-CM

## 2019-09-25 DIAGNOSIS — F10920 Alcohol use, unspecified with intoxication, uncomplicated: Secondary | ICD-10-CM

## 2019-09-25 DIAGNOSIS — I10 Essential (primary) hypertension: Secondary | ICD-10-CM | POA: Insufficient documentation

## 2019-09-25 DIAGNOSIS — C189 Malignant neoplasm of colon, unspecified: Secondary | ICD-10-CM | POA: Insufficient documentation

## 2019-09-25 DIAGNOSIS — Z590 Homelessness: Secondary | ICD-10-CM | POA: Insufficient documentation

## 2019-09-25 DIAGNOSIS — Z79899 Other long term (current) drug therapy: Secondary | ICD-10-CM | POA: Insufficient documentation

## 2019-09-25 DIAGNOSIS — Z87891 Personal history of nicotine dependence: Secondary | ICD-10-CM | POA: Insufficient documentation
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
## 2019-09-25 NOTE — ED Notes (Signed)
Walked out of room asking for something to drink, pt was given water and instructed to stay in bed for his safety.

## 2019-09-25 NOTE — ED Notes (Signed)
Pt walked into hallway requesting food and drink. Pt made aware that I have to get permission from provider. Pt instructed to go back into room

## 2019-09-25 NOTE — ED Notes (Addendum)
Pt given food and drink. Pt walked to bathroom with no assistance needed.
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
## 2019-09-25 NOTE — ED Notes (Signed)
Pt called out requesting more water and for the TV to be turned on. Pt again instructed to stay in bed and was given water.
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
## 2019-09-27 ENCOUNTER — Telehealth: Payer: Self-pay | Admitting: *Deleted

## 2019-09-27 NOTE — Telephone Encounter (Signed)
TOC CM attempted call to pt to follow up on referral for substance abuse/homelessness issues. Mailbox full. [place], [place] ED TOC CM [phone]

## 2019-10-31 ENCOUNTER — Encounter (HOSPITAL_COMMUNITY): Payer: Self-pay

## 2019-10-31 ENCOUNTER — Other Ambulatory Visit: Payer: Self-pay

## 2019-10-31 ENCOUNTER — Emergency Department (HOSPITAL_COMMUNITY): Payer: Medicare HMO

## 2019-10-31 ENCOUNTER — Emergency Department (HOSPITAL_COMMUNITY)
Admission: EM | Admit: 2019-10-31 | Discharge: 2019-11-01 | Disposition: A | Discharge status: Home / Self Care | Payer: Medicare HMO | Attending: Emergency Medicine | Admitting: Emergency Medicine

## 2019-10-31 DIAGNOSIS — E119 Type 2 diabetes mellitus without complications: Secondary | ICD-10-CM | POA: Insufficient documentation

## 2019-10-31 DIAGNOSIS — G8929 Other chronic pain: Secondary | ICD-10-CM | POA: Diagnosis not present

## 2019-10-31 DIAGNOSIS — I10 Essential (primary) hypertension: Secondary | ICD-10-CM | POA: Insufficient documentation

## 2019-10-31 DIAGNOSIS — R52 Pain, unspecified: Secondary | ICD-10-CM

## 2019-10-31 DIAGNOSIS — Z5900 Homelessness unspecified: Secondary | ICD-10-CM

## 2019-10-31 DIAGNOSIS — Z87891 Personal history of nicotine dependence: Secondary | ICD-10-CM | POA: Diagnosis not present

## 2019-10-31 DIAGNOSIS — Z85038 Personal history of other malignant neoplasm of large intestine: Secondary | ICD-10-CM | POA: Diagnosis not present

## 2019-10-31 DIAGNOSIS — F10920 Alcohol use, unspecified with intoxication, uncomplicated: Secondary | ICD-10-CM | POA: Insufficient documentation

## 2019-10-31 DIAGNOSIS — Z79899 Other long term (current) drug therapy: Secondary | ICD-10-CM | POA: Diagnosis not present

## 2019-10-31 DIAGNOSIS — M25551 Pain in right hip: Secondary | ICD-10-CM | POA: Diagnosis not present

## 2019-10-31 LAB — CBG MONITORING, ED: Glucose-Capillary: 120 mg/dL — ABNORMAL HIGH (ref 70–99)

## 2019-10-31 MED ORDER — ACETAMINOPHEN 325 MG PO TABS
650.0000 mg | ORAL_TABLET | Freq: Once | ORAL | Status: AC
  Administered 2019-10-31: 23:00:00 650 mg via ORAL
  Filled 2019-10-31: qty 2
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
## 2019-10-31 NOTE — ED Notes (Signed)
Assumed care of pt. Pt alert, resting on cart in NAD. Speaking in full sentences. Breathing easy, non-labored. VSS on monitors. Call light within reach.
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
## 2019-10-31 NOTE — ED Notes (Signed)
Pt taken to xray in NAD

## 2019-11-01 NOTE — Discharge Planning (Signed)
Fuller Mandril, RN, BSN, [place] [phone] Pt qualifies for DME rollater (rolling walker with seat).  DME  ordered through [facility].  Bethanne Ginger of [facility] notified to deliver rollater to pt room prior to D/C home.

## 2019-11-01 NOTE — ED Notes (Addendum)
Pt back from xray without incidence and placed back on monitors

## 2019-11-01 NOTE — ED Notes (Signed)
Bus pass given to patient.

## 2019-11-01 NOTE — ED Triage Notes (Signed)
PT had rollater ( rolling walker ) delivered. Pt reported he had a bus pass . Pt was escorted to front of ED in [facility] .

## 2019-11-01 NOTE — ED Notes (Signed)
Care endorsed to Cindy, RN 

## 2019-11-01 NOTE — ED Notes (Signed)
Pt incontinent of stool and urine. Pt turned, cleaned, and repositioned. New linens provided. Pt assisted with urinal and voided an additional 250 cc of urine. Placed in gown

## 2019-11-01 NOTE — ED Notes (Signed)
Hourly rounds completed. Pt resting on cart in NAD with eyes closed. Pt breathing easy, non-labored. Equal rise and fall of chest noted. Vss. Will continue to monitor

## 2019-11-01 NOTE — ED Notes (Signed)
MD Ward updated that pt ambulated and is at bedside

## 2019-11-01 NOTE — ED Notes (Signed)
Patient verbalizes understanding of discharge instructions. Opportunity for questioning and answers were provided. All questions answered. Pt moved to [facility] to wait for social work to arrive

## 2019-11-01 NOTE — Discharge Instructions (Addendum)
You may take over-the-counter Tylenol 1000 mg every 6 hours as needed for pain.   Home Health Stores:  [place] [address] [place] [address] [phone])  [place] [phone])  [place] [address] [phone])     Steps to find a Primary Care Provider (PCP):  Call [phone] or [phone] to access "[place] [facility] Doctor Service."  2.  You may also go on the [facility] website at [email or web address]  3.  [facility] also frequently accepts new patients.  [place] [phone]  4.  There are also multiple [facility], [facility] and [facility] practices throughout the [place] that are frequently accepting new patients. You may find a clinic that is close to your home and contact them.  [facility] [email or web address] [phone]  [facility] [email or web address]  [facility] [email or web address] [place] [email or web address] [phone]  5.  Local Health Departments also can provide primary care services.  [facility]  [place] [address] [phone]  [facility] [place] [address] [place] [phone]

## 2019-11-01 NOTE — ED Notes (Signed)
Pt provided with sandwich and apple juice. Denies any other needs at this time. Call light within reach. Hourly rounds completed

## 2019-11-01 NOTE — ED Notes (Signed)
Pt requesting to speak with social work before discharge. Pt reports he is homeless and would like to find a place to go. Charge RN updated. Pt to wait until social work arrives this AM prior to discharge

## 2019-11-01 NOTE — ED Notes (Signed)
Pt ambulatory in hall with tech using own walker with steady gait

## 2019-11-01 NOTE — ED Notes (Signed)
Pt resting on cart in NAD. Equal rise and fall of chest noted. Breathing easy, non-labored. Remains on continuous monitors. Denies any needs at this time. Hourly rounds completed

## 2019-11-04 ENCOUNTER — Emergency Department (HOSPITAL_COMMUNITY)
Admission: EM | Admit: 2019-11-04 | Discharge: 2019-11-04 | Disposition: A | Discharge status: Home / Self Care | Payer: Medicare HMO | Attending: Emergency Medicine | Admitting: Emergency Medicine

## 2019-11-04 ENCOUNTER — Emergency Department (HOSPITAL_COMMUNITY): Payer: Medicare HMO

## 2019-11-04 ENCOUNTER — Emergency Department (HOSPITAL_COMMUNITY)
Admission: EM | Admit: 2019-11-04 | Discharge: 2019-11-05 | Disposition: A | Discharge status: Home / Self Care | Payer: Medicare HMO | Source: Home / Self Care | Attending: Emergency Medicine | Admitting: Emergency Medicine

## 2019-11-04 ENCOUNTER — Other Ambulatory Visit: Payer: Self-pay

## 2019-11-04 DIAGNOSIS — Z590 Homelessness: Secondary | ICD-10-CM | POA: Insufficient documentation

## 2019-11-04 DIAGNOSIS — S0083XA Contusion of other part of head, initial encounter: Secondary | ICD-10-CM | POA: Diagnosis not present

## 2019-11-04 DIAGNOSIS — Y929 Unspecified place or not applicable: Secondary | ICD-10-CM | POA: Insufficient documentation

## 2019-11-04 DIAGNOSIS — S0990XA Unspecified injury of head, initial encounter: Secondary | ICD-10-CM | POA: Diagnosis present

## 2019-11-04 DIAGNOSIS — Y998 Other external cause status: Secondary | ICD-10-CM | POA: Insufficient documentation

## 2019-11-04 DIAGNOSIS — Y9389 Activity, other specified: Secondary | ICD-10-CM | POA: Diagnosis not present

## 2019-11-04 DIAGNOSIS — I10 Essential (primary) hypertension: Secondary | ICD-10-CM | POA: Insufficient documentation

## 2019-11-04 DIAGNOSIS — M25532 Pain in left wrist: Secondary | ICD-10-CM | POA: Insufficient documentation

## 2019-11-04 DIAGNOSIS — Z85038 Personal history of other malignant neoplasm of large intestine: Secondary | ICD-10-CM | POA: Diagnosis not present

## 2019-11-04 DIAGNOSIS — Z79899 Other long term (current) drug therapy: Secondary | ICD-10-CM | POA: Insufficient documentation

## 2019-11-04 DIAGNOSIS — W19XXXA Unspecified fall, initial encounter: Secondary | ICD-10-CM

## 2019-11-04 DIAGNOSIS — R040 Epistaxis: Secondary | ICD-10-CM | POA: Insufficient documentation

## 2019-11-04 DIAGNOSIS — S42402A Unspecified fracture of lower end of left humerus, initial encounter for closed fracture: Secondary | ICD-10-CM

## 2019-11-04 DIAGNOSIS — Z87891 Personal history of nicotine dependence: Secondary | ICD-10-CM | POA: Insufficient documentation

## 2019-11-04 DIAGNOSIS — E119 Type 2 diabetes mellitus without complications: Secondary | ICD-10-CM | POA: Insufficient documentation

## 2019-11-04 MED ORDER — ONDANSETRON HCL 4 MG PO TABS
8.0000 mg | ORAL_TABLET | Freq: Once | ORAL | Status: AC
  Administered 2019-11-04: 8 mg via ORAL
  Filled 2019-11-04: qty 2

## 2019-11-04 MED ORDER — THIAMINE HCL 100 MG PO TABS
100.0000 mg | ORAL_TABLET | Freq: Once | ORAL | Status: AC
  Administered 2019-11-04: 19:00:00 100 mg via ORAL
  Filled 2019-11-04: qty 1

## 2019-11-04 NOTE — Social Work (Signed)
EDCSW met with Pt at bedside. CSW and Pt discussed several local resources such as the [facility] and [employer].[email or web address] offered list of Free melas in [place], but Pt declined stating that he receives a box of food from [facility] weekly and does eat at [employer] occasionally. CSW also discussed alcohol use, Pt was pleasant and honest about use and stated that he would be willing to accept resource list for detox and recovery, but he feels "it's too late now" CSW encouraged Pt by pointing out that it's never too late to make positive changes. CSW also supplied list of homelessness resources.
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
## 2019-11-04 NOTE — ED Notes (Signed)
Pt transported to XR.  

## 2019-11-04 NOTE — Progress Notes (Signed)
Orthopedic Tech Progress Note Patient Details:  Caleb Andrews [birth year] [id number]  Ortho Devices Type of Ortho Device: Ace wrap, Long arm splint, Arm sling Ortho Device/Splint Interventions: Application   Post Interventions Patient Tolerated: Well Instructions Provided: Care of device   [name] 11/04/2019, 1:24 PM

## 2019-11-04 NOTE — ED Notes (Signed)
Pt provided with turkey sandwich  and ice water 

## 2019-11-04 NOTE — ED Notes (Signed)
Pt states his head does not hurt. 0/10. Pt states it is both hips that hurt. 10/10
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
## 2019-11-04 NOTE — Discharge Instructions (Addendum)
Follow up with orthopedic surgery in 1 week
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
## 2019-11-05 ENCOUNTER — Other Ambulatory Visit: Payer: Self-pay

## 2019-11-05 ENCOUNTER — Emergency Department (HOSPITAL_COMMUNITY)
Admission: EM | Admit: 2019-11-05 | Discharge: 2019-11-06 | Disposition: A | Discharge status: Home / Self Care | Payer: Medicare HMO | Attending: Emergency Medicine | Admitting: Emergency Medicine

## 2019-11-05 DIAGNOSIS — R1013 Epigastric pain: Secondary | ICD-10-CM | POA: Insufficient documentation

## 2019-11-05 DIAGNOSIS — Z87891 Personal history of nicotine dependence: Secondary | ICD-10-CM | POA: Diagnosis not present

## 2019-11-05 DIAGNOSIS — Z79899 Other long term (current) drug therapy: Secondary | ICD-10-CM | POA: Insufficient documentation

## 2019-11-05 DIAGNOSIS — I10 Essential (primary) hypertension: Secondary | ICD-10-CM | POA: Diagnosis not present

## 2019-11-05 DIAGNOSIS — W19XXXD Unspecified fall, subsequent encounter: Secondary | ICD-10-CM

## 2019-11-05 DIAGNOSIS — Z590 Homelessness: Secondary | ICD-10-CM | POA: Diagnosis not present
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
## 2019-11-05 NOTE — Social Work (Signed)
CSW reached out to [facility] in an effort to connect Pt with services.

## 2019-11-06 DIAGNOSIS — R1013 Epigastric pain: Secondary | ICD-10-CM | POA: Diagnosis not present

## 2019-11-06 MED ORDER — HYDROCHLOROTHIAZIDE 12.5 MG PO CAPS
12.5000 mg | ORAL_CAPSULE | Freq: Once | ORAL | Status: AC
  Administered 2019-11-06: 12.5 mg via ORAL
  Filled 2019-11-06: qty 1

## 2019-11-06 MED ORDER — LISINOPRIL 20 MG PO TABS
40.0000 mg | ORAL_TABLET | Freq: Once | ORAL | Status: AC
  Administered 2019-11-06: 16:00:00 40 mg via ORAL
  Filled 2019-11-06: qty 2

## 2019-11-06 NOTE — ED Notes (Signed)
Social Work AM  Breakfast ordered

## 2019-11-06 NOTE — ED Notes (Signed)
Pt had incontinent episode, pt gown and linen changed

## 2019-11-06 NOTE — ED Notes (Addendum)
Pt noted w/condom cath intact. ALL of pt's belongings in room - black trash bag noted w/pt's items as well.
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
## 2019-11-06 NOTE — Discharge Instructions (Signed)
Follow-up for your elbow as instructed before at your last emergency visit.

## 2019-11-06 NOTE — ED Notes (Addendum)
Assisted pt w/dressing into personal clothing - multiple shirts and hospital pants d/t his pants were soiled. Arm sling applied to left arm. D/C instructions given and questions answered to satisfaction - Pt unable to sign. Voiced understanding to follow up w/ortho as directed. Also verbalized understanding to take home meds - esp BP meds. Pt able to wiggle fingers - min swelling noted  - encouraged pt to elevate arm. Taxi voucher given and taxi called - [employer] advised he will request for patrol to go to pt's nephew's house when taxi picks up pt to assist pt w/obtaining his debit card - [place]. Pt ambulated w/rollator walker and RN pushed pt's large black trash bag and belongings bag in w/c. Security to assist pt w/belongings when taxi arrives. Pt verbalized understanding to return to ED if worsening symptoms or emergency. Pt also voiced understanding EDP is not able to assist pt w/housing.
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
## 2019-11-06 NOTE — ED Notes (Addendum)
Pt asked for bedpan - instructed pt RN will assist him w/ambulating to bathroom - pt stated "The urge has left me now".

## 2019-11-07 ENCOUNTER — Encounter (HOSPITAL_COMMUNITY): Payer: Self-pay

## 2019-11-07 ENCOUNTER — Emergency Department (HOSPITAL_COMMUNITY)
Admission: EM | Admit: 2019-11-07 | Discharge: 2019-11-08 | Disposition: A | Discharge status: Home / Self Care | Payer: Medicare HMO | Source: Home / Self Care | Attending: Emergency Medicine | Admitting: Emergency Medicine

## 2019-11-07 ENCOUNTER — Emergency Department (HOSPITAL_COMMUNITY)
Admission: EM | Admit: 2019-11-07 | Discharge: 2019-11-07 | Disposition: A | Discharge status: Home / Self Care | Payer: Medicare HMO | Attending: Emergency Medicine | Admitting: Emergency Medicine

## 2019-11-07 ENCOUNTER — Other Ambulatory Visit: Payer: Self-pay

## 2019-11-07 ENCOUNTER — Emergency Department (HOSPITAL_COMMUNITY): Payer: Medicare HMO

## 2019-11-07 ENCOUNTER — Encounter (HOSPITAL_COMMUNITY): Payer: Self-pay | Admitting: Emergency Medicine

## 2019-11-07 DIAGNOSIS — Z87891 Personal history of nicotine dependence: Secondary | ICD-10-CM | POA: Insufficient documentation

## 2019-11-07 DIAGNOSIS — T68XXXA Hypothermia, initial encounter: Secondary | ICD-10-CM | POA: Insufficient documentation

## 2019-11-07 DIAGNOSIS — I10 Essential (primary) hypertension: Secondary | ICD-10-CM | POA: Insufficient documentation

## 2019-11-07 DIAGNOSIS — F10920 Alcohol use, unspecified with intoxication, uncomplicated: Secondary | ICD-10-CM

## 2019-11-07 DIAGNOSIS — Y903 Blood alcohol level of 60-79 mg/100 ml: Secondary | ICD-10-CM | POA: Insufficient documentation

## 2019-11-07 DIAGNOSIS — R109 Unspecified abdominal pain: Secondary | ICD-10-CM | POA: Diagnosis present

## 2019-11-07 DIAGNOSIS — Z79899 Other long term (current) drug therapy: Secondary | ICD-10-CM | POA: Diagnosis not present

## 2019-11-07 DIAGNOSIS — Z5900 Homelessness unspecified: Secondary | ICD-10-CM

## 2019-11-07 DIAGNOSIS — F10929 Alcohol use, unspecified with intoxication, unspecified: Secondary | ICD-10-CM | POA: Insufficient documentation

## 2019-11-07 DIAGNOSIS — K529 Noninfective gastroenteritis and colitis, unspecified: Secondary | ICD-10-CM | POA: Diagnosis not present

## 2019-11-07 DIAGNOSIS — E119 Type 2 diabetes mellitus without complications: Secondary | ICD-10-CM | POA: Insufficient documentation

## 2019-11-07 DIAGNOSIS — X31XXXA Exposure to excessive natural cold, initial encounter: Secondary | ICD-10-CM | POA: Insufficient documentation

## 2019-11-07 DIAGNOSIS — Z85038 Personal history of other malignant neoplasm of large intestine: Secondary | ICD-10-CM | POA: Insufficient documentation

## 2019-11-07 HISTORY — DX: Homelessness unspecified: Z59.00

## 2019-11-07 LAB — COMPREHENSIVE METABOLIC PANEL
ALT: 21 U/L (ref 0–44)
ALT: 22 U/L (ref 0–44)
AST: 18 U/L (ref 15–41)
AST: 20 U/L (ref 15–41)
Albumin: 2.8 g/dL — ABNORMAL LOW (ref 3.5–5.0)
Albumin: 3.4 g/dL — ABNORMAL LOW (ref 3.5–5.0)
Alkaline Phosphatase: 83 U/L (ref 38–126)
Alkaline Phosphatase: 87 U/L (ref 38–126)
Anion gap: 12 (ref 5–15)
Anion gap: 17 — ABNORMAL HIGH (ref 5–15)
BUN: 16 mg/dL (ref 8–23)
BUN: 26 mg/dL — ABNORMAL HIGH (ref 8–23)
CO2: 25 mmol/L (ref 22–32)
CO2: 23 mmol/L (ref 22–32)
Calcium: 9.3 mg/dL (ref 8.9–10.3)
Calcium: 9.4 mg/dL (ref 8.9–10.3)
Chloride: 94 mmol/L — ABNORMAL LOW (ref 98–111)
Chloride: 91 mmol/L — ABNORMAL LOW (ref 98–111)
Creatinine, Ser: 1.21 mg/dL (ref 0.61–1.24)
Creatinine, Ser: 1.35 mg/dL — ABNORMAL HIGH (ref 0.61–1.24)
GFR calc Af Amer: >60 mL/min (ref >60)
GFR calc Af Amer: 58 mL/min — ABNORMAL LOW (ref >60)
GFR calc non Af Amer: 57 mL/min — ABNORMAL LOW (ref >60)
GFR calc non Af Amer: 50 mL/min — ABNORMAL LOW (ref >60)
Glucose, Bld: 140 mg/dL — ABNORMAL HIGH (ref 70–99)
Glucose, Bld: 167 mg/dL — ABNORMAL HIGH (ref 70–99)
Potassium: 3.6 mmol/L (ref 3.5–5.1)
Potassium: 3.1 mmol/L — ABNORMAL LOW (ref 3.5–5.1)
Sodium: 131 mmol/L — ABNORMAL LOW (ref 135–145)
Sodium: 131 mmol/L — ABNORMAL LOW (ref 135–145)
Total Bilirubin: 1 mg/dL (ref 0.3–1.2)
Total Bilirubin: 0.7 mg/dL (ref 0.3–1.2)
Total Protein: 6.8 g/dL (ref 6.5–8.1)
Total Protein: 8.1 g/dL (ref 6.5–8.1)

## 2019-11-07 LAB — CBC WITH DIFFERENTIAL/PLATELET
Abs Immature Granulocytes: 0.06 10*3/uL (ref 0.00–0.07)
Basophils Absolute: 0 10*3/uL (ref 0.0–0.1)
Basophils Relative: 0 %
Eosinophils Absolute: 0 10*3/uL (ref 0.0–0.5)
Eosinophils Relative: 0 %
HCT: 44.9 % (ref 39.0–52.0)
Hemoglobin: 15.6 g/dL (ref 13.0–17.0)
Immature Granulocytes: 1 %
Lymphocytes Relative: 8 %
Lymphs Abs: 0.8 10*3/uL (ref 0.7–4.0)
MCH: 31.7 pg (ref 26.0–34.0)
MCHC: 34.7 g/dL (ref 30.0–36.0)
MCV: 91.3 fL (ref 80.0–100.0)
Monocytes Absolute: 0.6 10*3/uL (ref 0.1–1.0)
Monocytes Relative: 6 %
Neutro Abs: 7.8 10*3/uL — ABNORMAL HIGH (ref 1.7–7.7)
Neutrophils Relative %: 85 %
Platelets: 261 10*3/uL (ref 150–400)
RBC: 4.92 MIL/uL (ref 4.22–5.81)
RDW: 12.6 % (ref 11.5–15.5)
WBC: 9.3 10*3/uL (ref 4.0–10.5)
nRBC: 0 % (ref 0.0–0.2)

## 2019-11-07 LAB — CBC
HCT: 41.1 % (ref 39.0–52.0)
Hemoglobin: 14.2 g/dL (ref 13.0–17.0)
MCH: 31.7 pg (ref 26.0–34.0)
MCHC: 34.5 g/dL (ref 30.0–36.0)
MCV: 91.7 fL (ref 80.0–100.0)
Platelets: 250 10*3/uL (ref 150–400)
RBC: 4.48 MIL/uL (ref 4.22–5.81)
RDW: 12.5 % (ref 11.5–15.5)
WBC: 9.6 10*3/uL (ref 4.0–10.5)
nRBC: 0 % (ref 0.0–0.2)

## 2019-11-07 LAB — LIPASE, BLOOD
Lipase: 20 U/L (ref 11–51)
Lipase: 23 U/L (ref 11–51)

## 2019-11-07 LAB — ETHANOL: Alcohol, Ethyl (B): 78 mg/dL — ABNORMAL HIGH (ref <10)

## 2019-11-07 LAB — LACTIC ACID, PLASMA: Lactic Acid, Venous: 3.3 mmol/L (ref 0.5–1.9)

## 2019-11-07 MED ORDER — SODIUM CHLORIDE 0.9 % IV BOLUS
1000.0000 mL | Freq: Once | INTRAVENOUS | Status: AC
  Administered 2019-11-07: 1000 mL via INTRAVENOUS

## 2019-11-07 MED ORDER — SODIUM CHLORIDE 0.9% FLUSH: 3.0000 mL | Freq: Once | INTRAVENOUS | Status: DC

## 2019-11-07 MED ORDER — ACETAMINOPHEN 500 MG PO TABS
1000.0000 mg | ORAL_TABLET | Freq: Once | ORAL | Status: AC
  Administered 2019-11-07: 06:00:00 1000 mg via ORAL
  Filled 2019-11-07: qty 2

## 2019-11-07 MED ORDER — ALUM & MAG HYDROXIDE-SIMETH 200-200-20 MG/5ML PO SUSP
30.0000 mL | Freq: Once | ORAL | Status: AC
  Administered 2019-11-07: 30 mL via ORAL
  Filled 2019-11-07: qty 30

## 2019-11-07 MED ORDER — FAMOTIDINE 20 MG PO TABS: 20.0000 mg | ORAL_TABLET | Freq: Two times a day (BID) | ORAL | 0 refills | Status: DC

## 2019-11-07 NOTE — ED Notes (Signed)
Date and time results received: 11/07/19 10:13 PM  (use smartphrase ".now" to insert current time)  Test: Lactic Acid Critical Value: 3.3  Name of Provider Notified: Dr.Little  Orders Received? Or Actions Taken?:

## 2019-11-07 NOTE — ED Triage Notes (Signed)
Patient reports mid abdominal cramping onset this morning , denies emesis or diarrhea , no fever or chills. He is homeless.
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
## 2019-11-07 NOTE — ED Notes (Signed)
Patients rectal temperature 93.2, Dr. Rex Kras made aware and Bair Hugger applied.
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
## 2019-11-07 NOTE — ED Notes (Signed)
Pt undressed and placed on bair hugger due to rectal temp of 93.2. Little, MD aware.
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
## 2019-11-07 NOTE — ED Notes (Signed)
Pt able to eat and drink, no n/v

## 2019-11-08 ENCOUNTER — Encounter: Payer: Self-pay | Admitting: *Deleted
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
## 2019-11-08 NOTE — ED Notes (Signed)
Patient yelling at staff stating that he did not want to come here that he wanted to go to [facility], this nurse explained that we have no control about where EMS brings him to please refrain from yelling at staff.
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
## 2019-11-08 NOTE — ED Notes (Signed)
Pt able to ambulate with steady gait in room and dressed himself. Pt upset with staff that pt was brought to [facility] and not [facility]. Explained to pt staff at either ED have no control over where pt is transported to by EMS. Pt still upset with staff- pt cussing/yelling at staff. Pt was asked to please refrain from cussing/yelling.

## 2019-11-08 NOTE — ED Notes (Signed)
Pt given sprite and is tolerating well.

## 2019-11-08 NOTE — ED Notes (Signed)
Patient ambulating around the room, currently getting dressed.

## 2019-11-08 NOTE — ED Notes (Signed)
Pt cleaned and provided with peri care after pt removed condom cath that was in place and had incontinent episode. Pt given urinal due to not wanting to use a condom cath anymore. Call bell within reach.

## 2019-11-08 NOTE — ED Provider Notes (Signed)
Patient signed out to me by Dr. Rex Kras.  Patient has been seen earlier today for cold exposure.  Patient found to be hypothermic after being found outside in the cold with wet clothing.  He has done well overnight.  He had been hydrated for mild dehydration and he is now normothermic.  Patient will be appropriate for discharge.   Orpah Greek, MD 11/08/19 [phone]

## 2019-11-09 ENCOUNTER — Emergency Department (HOSPITAL_COMMUNITY)
Admission: EM | Admit: 2019-11-09 | Discharge: 2019-11-10 | Disposition: A | Discharge status: Home / Self Care | Payer: Medicare HMO | Source: Home / Self Care | Attending: Emergency Medicine | Admitting: Emergency Medicine

## 2019-11-09 ENCOUNTER — Encounter (HOSPITAL_COMMUNITY): Payer: Self-pay | Admitting: Emergency Medicine

## 2019-11-09 ENCOUNTER — Emergency Department (HOSPITAL_COMMUNITY)
Admission: EM | Admit: 2019-11-09 | Discharge: 2019-11-09 | Disposition: A | Discharge status: Home / Self Care | Payer: Medicare HMO | Attending: Emergency Medicine | Admitting: Emergency Medicine

## 2019-11-09 ENCOUNTER — Other Ambulatory Visit: Payer: Self-pay

## 2019-11-09 ENCOUNTER — Encounter (HOSPITAL_COMMUNITY): Payer: Self-pay

## 2019-11-09 DIAGNOSIS — G8929 Other chronic pain: Secondary | ICD-10-CM

## 2019-11-09 DIAGNOSIS — Z85038 Personal history of other malignant neoplasm of large intestine: Secondary | ICD-10-CM | POA: Insufficient documentation

## 2019-11-09 DIAGNOSIS — M25552 Pain in left hip: Secondary | ICD-10-CM | POA: Insufficient documentation

## 2019-11-09 DIAGNOSIS — E119 Type 2 diabetes mellitus without complications: Secondary | ICD-10-CM | POA: Insufficient documentation

## 2019-11-09 DIAGNOSIS — Z87891 Personal history of nicotine dependence: Secondary | ICD-10-CM | POA: Insufficient documentation

## 2019-11-09 DIAGNOSIS — I10 Essential (primary) hypertension: Secondary | ICD-10-CM | POA: Insufficient documentation

## 2019-11-09 DIAGNOSIS — Z79899 Other long term (current) drug therapy: Secondary | ICD-10-CM | POA: Diagnosis not present

## 2019-11-09 DIAGNOSIS — Z5900 Homelessness unspecified: Secondary | ICD-10-CM

## 2019-11-09 LAB — CBG MONITORING, ED: Glucose-Capillary: 165 mg/dL — ABNORMAL HIGH (ref 70–99)

## 2019-11-09 MED ORDER — ACETAMINOPHEN 500 MG PO TABS
1000.0000 mg | ORAL_TABLET | Freq: Once | ORAL | Status: AC
  Administered 2019-11-09: 05:00:00 1000 mg via ORAL
  Filled 2019-11-09: qty 2

## 2019-11-09 NOTE — ED Notes (Signed)
Pt is eating breakfast  

## 2019-11-09 NOTE — ED Triage Notes (Signed)
Pt reports hip pain for the past two hours due to the cold, denies injury

## 2019-11-09 NOTE — Progress Notes (Signed)
CSW received consult for patient to assist with resources. This patient has been at [facility] ED six times in the past two weeks. Patient has been seen by [facility] staff several times and was given resource lists each visit including housing, food, and shelter lists.   Madilyn Fireman, MSW, LCSW-A Transitions of Care  Clinical Social Worker  [facility] Emergency Departments  Medical ICU [phone]

## 2019-11-09 NOTE — ED Triage Notes (Signed)
Patient reports left hip pain onset today , denies injury/ambulatory , he is homeless ,denies fever or chills .

## 2019-11-09 NOTE — ED Notes (Signed)
Patient up and organizing belongings.  States need for SW.  MD ordered consult.

## 2019-11-09 NOTE — Discharge Instructions (Addendum)
You may take Tylenol 1000 mg every 6 hours as needed for pain. °
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
## 2019-11-09 NOTE — ED Provider Notes (Signed)
Care assumed with Dr. Leonides Schanz.  At time of transfer care, patient is awaiting social work evaluation.  Plan of care is to discharge patient after receiving social work.  Patient was discharged after social work evaluation.   [name], [name], MD 11/09/19 [phone]

## 2019-11-10 ENCOUNTER — Encounter (HOSPITAL_COMMUNITY): Payer: Self-pay | Admitting: Family Medicine

## 2019-11-10 ENCOUNTER — Other Ambulatory Visit: Payer: Self-pay

## 2019-11-10 ENCOUNTER — Emergency Department (HOSPITAL_COMMUNITY)
Admission: EM | Admit: 2019-11-10 | Discharge: 2019-11-11 | Disposition: A | Discharge status: Home / Self Care | Payer: Medicare HMO | Attending: Emergency Medicine | Admitting: Emergency Medicine

## 2019-11-10 DIAGNOSIS — Y939 Activity, unspecified: Secondary | ICD-10-CM | POA: Insufficient documentation

## 2019-11-10 DIAGNOSIS — Y999 Unspecified external cause status: Secondary | ICD-10-CM | POA: Insufficient documentation

## 2019-11-10 DIAGNOSIS — I10 Essential (primary) hypertension: Secondary | ICD-10-CM | POA: Diagnosis not present

## 2019-11-10 DIAGNOSIS — S0990XA Unspecified injury of head, initial encounter: Secondary | ICD-10-CM | POA: Diagnosis present

## 2019-11-10 DIAGNOSIS — W010XXA Fall on same level from slipping, tripping and stumbling without subsequent striking against object, initial encounter: Secondary | ICD-10-CM | POA: Insufficient documentation

## 2019-11-10 DIAGNOSIS — Z20822 Contact with and (suspected) exposure to covid-19: Secondary | ICD-10-CM | POA: Diagnosis not present

## 2019-11-10 DIAGNOSIS — F10920 Alcohol use, unspecified with intoxication, uncomplicated: Secondary | ICD-10-CM

## 2019-11-10 DIAGNOSIS — Z85038 Personal history of other malignant neoplasm of large intestine: Secondary | ICD-10-CM | POA: Diagnosis not present

## 2019-11-10 DIAGNOSIS — Z590 Homelessness: Secondary | ICD-10-CM | POA: Diagnosis not present

## 2019-11-10 DIAGNOSIS — W19XXXA Unspecified fall, initial encounter: Secondary | ICD-10-CM

## 2019-11-10 DIAGNOSIS — Y929 Unspecified place or not applicable: Secondary | ICD-10-CM | POA: Insufficient documentation

## 2019-11-10 DIAGNOSIS — S0081XA Abrasion of other part of head, initial encounter: Secondary | ICD-10-CM | POA: Insufficient documentation

## 2019-11-10 DIAGNOSIS — E119 Type 2 diabetes mellitus without complications: Secondary | ICD-10-CM | POA: Diagnosis not present

## 2019-11-10 DIAGNOSIS — F10120 Alcohol abuse with intoxication, uncomplicated: Secondary | ICD-10-CM | POA: Diagnosis not present

## 2019-11-10 DIAGNOSIS — Z79899 Other long term (current) drug therapy: Secondary | ICD-10-CM | POA: Insufficient documentation

## 2019-11-10 DIAGNOSIS — Z87891 Personal history of nicotine dependence: Secondary | ICD-10-CM | POA: Diagnosis not present

## 2019-11-10 NOTE — ED Triage Notes (Signed)
[place]. When EMS arrived he could not stand up by himself. He admitted to drinking ETOH tonight. EMS states he has an abrasion to the middle of forehead. EMS placed patient in a c-collar.

## 2019-11-10 NOTE — ED Notes (Signed)
Patient verbalizes understanding of discharge instructions. Opportunity for questioning and answers were provided. Armband removed by staff, pt discharged from ED ambulatory.   
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
## 2019-11-11 ENCOUNTER — Emergency Department (HOSPITAL_COMMUNITY): Payer: Medicare HMO

## 2019-11-11 DIAGNOSIS — S0081XA Abrasion of other part of head, initial encounter: Secondary | ICD-10-CM | POA: Diagnosis not present

## 2019-11-11 LAB — CBC
HCT: 41.4 % (ref 39.0–52.0)
Hemoglobin: 13.6 g/dL (ref 13.0–17.0)
MCH: 31.2 pg (ref 26.0–34.0)
MCHC: 32.9 g/dL (ref 30.0–36.0)
MCV: 95 fL (ref 80.0–100.0)
Platelets: 304 10*3/uL (ref 150–400)
RBC: 4.36 MIL/uL (ref 4.22–5.81)
RDW: 12.5 % (ref 11.5–15.5)
WBC: 5.2 10*3/uL (ref 4.0–10.5)
nRBC: 0 % (ref 0.0–0.2)

## 2019-11-11 LAB — COMPREHENSIVE METABOLIC PANEL
ALT: 26 U/L (ref 0–44)
AST: 29 U/L (ref 15–41)
Albumin: 3.2 g/dL — ABNORMAL LOW (ref 3.5–5.0)
Alkaline Phosphatase: 92 U/L (ref 38–126)
Anion gap: 14 (ref 5–15)
BUN: 28 mg/dL — ABNORMAL HIGH (ref 8–23)
CO2: 25 mmol/L (ref 22–32)
Calcium: 9.2 mg/dL (ref 8.9–10.3)
Chloride: 100 mmol/L (ref 98–111)
Creatinine, Ser: 1.01 mg/dL (ref 0.61–1.24)
GFR calc Af Amer: >60 mL/min (ref >60)
GFR calc non Af Amer: >60 mL/min (ref >60)
Glucose, Bld: 147 mg/dL — ABNORMAL HIGH (ref 70–99)
Potassium: 3.6 mmol/L (ref 3.5–5.1)
Sodium: 139 mmol/L (ref 135–145)
Total Bilirubin: 0.4 mg/dL (ref 0.3–1.2)
Total Protein: 7.3 g/dL (ref 6.5–8.1)

## 2019-11-11 LAB — ETHANOL: Alcohol, Ethyl (B): 192 mg/dL — ABNORMAL HIGH (ref <10)

## 2019-11-11 LAB — CBG MONITORING, ED: Glucose-Capillary: 109 mg/dL — ABNORMAL HIGH (ref 70–99)

## 2019-11-11 LAB — POC SARS CORONAVIRUS 2 AG -  ED: SARS Coronavirus 2 Ag: NEGATIVE

## 2019-11-11 NOTE — Progress Notes (Addendum)
TOC CM working on getting pt a RW for home. Approval sent to for LOG for RW, not approved. Contacted [facility], waiting on call back. Jonnie Finner RN CCM, [facility] ED TOC CM [phone]  4:30 pm RW was provided to pt by [facility] acute rehab. Pt provided clothes from clothes closet. He was provided dinner.   6:30 pm Pt picked up by [employer] taxi to go to [facility]. COVID test was negative and information called to Jackelyn Poling, [facility]. [place], [place] ED TOC CM [phone]

## 2019-11-11 NOTE — Progress Notes (Signed)
CSW updated by the [facility] CM that the shelter requires a negative COVID test.  Per RN CM a rapid covide test is being requested.  CSW will continue to follow for D/C needs.  Alphonse Guild. Dori Devino, LCSW, LCAS, CSI Transitions of Care Clinical Social Worker Care Coordination Department Ph: [phone]

## 2019-11-11 NOTE — Progress Notes (Signed)
CSW received a call from RN CM stating the pt's negative COVID test was received.  [facility] transportation services are now closed and CSW will provide pt with a taxi voucher for a safe D/C to [facility] where [facility] has secured a bed for the pt.  Per [employer], a taxi is now en route to p/u the pt with ETA of 6:35pm  CSW will continue to follow for D/C needs.  Alphonse Guild. Sissi Padia, LCSW, LCAS, CSI Transitions of Care Clinical Social Worker Care Coordination Department Ph: [phone]

## 2019-11-11 NOTE — Progress Notes (Signed)
CSW received a request from the RN CM to set up transport for the pt to [facility] at [address].  Transport was provided with the # for the [facility] RN CM who is with the pt.  Pt will be picked up at approx 5:16pm by a [employer] at the ED which will transport the pt to [facility].  RN CM updated.  CSW will continue to follow for D/C needs.  Caleb Andrews. Caleb Fray, LCSW, LCAS, CSI Transitions of Care Clinical Social Worker Care Coordination Department Ph: [phone]
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
## 2019-11-11 NOTE — ED Provider Notes (Addendum)
Care assumed from [facility], [place], at shift change, please see their notes for full documentation of patient's complaint/HPI. Briefly, pt here with fall and alcohol abuse. Results so far show reassuring labs and neg CT scans. Awaiting social work consult. Plan is to dispo accordingly. Pt typically ambulates with walker however did not come with his walker with EMS; pt is unsure where is walker is. He will need to be ambulated with walker prior to dispo.    Physical Exam  BP (!) 155/99 (BP Location: Right Wrist)   Pulse 82   Temp (!) 94.8 F (34.9 C) (Rectal) Comment: Mia PA made aware  Resp 17   SpO2 100%   Physical Exam Vitals and nursing note reviewed.  Constitutional:      Appearance: He is not ill-appearing.  HENT:     Head: Normocephalic and atraumatic.  Eyes:     Conjunctiva/sclera: Conjunctivae normal.  Cardiovascular:     Rate and Rhythm: Normal rate and regular rhythm.     Pulses: Normal pulses.  Pulmonary:     Effort: Pulmonary effort is normal.     Breath sounds: Normal breath sounds. No wheezing, rhonchi or rales.  Skin:    General: Skin is warm and dry.     Coloration: Skin is not jaundiced.  Neurological:     Mental Status: He is alert.     ED Course/Procedures   Clinical Course as of Nov 10 1213  Thu Nov 11, 2019  [id number] Repeat rectal temp; it does not appear pt was placed on a bearhugger   Temp(!): 96.8 F (36 C) [MV]  [phone] Social work requesting peer support consult for alcohol abuse. Will place consult   [MV]  1200 Temp: 98.7 F (37.1 C) [MV]    Clinical Course User Index [MV] Eustaquio Maize, PA-C    Procedures  MDM  Rectal temp this morning 96.8; it does not appear pt was placed on bair hugger overnight. Will place at this time. Will repeat shortly to reassess.   Repeat rectal temp 98.7. Pt is alert and oriented x 4. Sitting upright eating in bed. Taken off of Retail banker.   Social work has evaluated patient; attempting to get walker. Has  recommended peer consult prior to discharge due to alcohol abuse.   Personally visualized pt ambulating without walker in hallway however he had to hold onto railing for safety. Social work attempted to get pt another walker (he just receives one Jan 23rd apparently and cannot find it), have contacted PT to see if they have a walker pt can have.   Pt received a wallker. [facility]         Eustaquio Maize, PA-C 11/11/19 [place], [place], DO 11/12/19 [phone]

## 2019-11-11 NOTE — Patient Outreach (Signed)
ED Peer Support Specialist Patient Intake (Complete at intake & 30-60 Day Follow-up)  Name: Caleb Andrews  MRN: [id number]  Age: 79 y.o.   Date of Admission: 11/11/2019  Intake: Initial Comments:      Primary Reason Admitted: Fall, Alcohol Intoxication   Lab values: Alcohol/ETOH: Positive Positive UDS? No Amphetamines: No Barbiturates: No Benzodiazepines: No Cocaine: No Opiates: No Cannabinoids: No  Demographic information: Gender: Male Ethnicity: African American Marital Status: Single Insurance Status: Marketing executive (Work Neurosurgeon, Physicist, medical, etc.: No Lives with: Alone Living situation: Homeless  Reported Patient History: Patient reported health conditions: None Patient aware of HIV and hepatitis status: No  In past year, has patient visited ED for any reason? Yes  Number of ED visits: 4  Reason(s) for visit: various reasons  In past year, has patient been hospitalized for any reason? No  Number of hospitalizations:    Reason(s) for hospitalization:    In past year, has patient been arrested? No  Number of arrests:    Reason(s) for arrest:    In past year, has patient been incarcerated? No  Number of incarcerations:    Reason(s) for incarceration:    In past year, has patient received medication-assisted treatment? No  In past year, patient received the following treatments: Other (comment)  In past year, has patient received any harm reduction services? No  Did this include any of the following?    In past year, has patient received care from a mental health provider for diagnosis other than SUD? No  In past year, is this first time patient has overdosed? No  Number of past overdoses:    In past year, is this first time patient has been hospitalized for an overdose? No  Number of hospitalizations for overdose(s):    Is patient currently receiving treatment for a mental health diagnosis?  No  Patient reports experiencing difficulty participating in SUD treatment: No    Most important reason(s) for this difficulty?    Has patient received prior services for treatment? No  In past, patient has received services from following agencies:    Plan of Care:  Suggested follow up at these agencies/treatment centers: Other (comment)(Gave AA meeting information)  Other information: CPSS processed with Pt an was able to gain information as to how an why Pt is drinking an resulted to being homeless. CPSS was made aware that he has been dealing with the loss of his wife from some years back. Pt agree to take AA meeting information which he stated he may attempt to attend.    Aaron Edelman Izeah Vossler, CPSS  11/11/2019 1:52 PM
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
## 2019-11-12 ENCOUNTER — Other Ambulatory Visit: Payer: Self-pay

## 2019-11-12 ENCOUNTER — Emergency Department (HOSPITAL_COMMUNITY)
Admission: EM | Admit: 2019-11-12 | Discharge: 2019-11-13 | Disposition: A | Discharge status: Home / Self Care | Payer: Medicare HMO | Attending: Emergency Medicine | Admitting: Emergency Medicine

## 2019-11-12 ENCOUNTER — Encounter (HOSPITAL_COMMUNITY): Payer: Self-pay | Admitting: Emergency Medicine

## 2019-11-12 DIAGNOSIS — I10 Essential (primary) hypertension: Secondary | ICD-10-CM | POA: Insufficient documentation

## 2019-11-12 DIAGNOSIS — M25552 Pain in left hip: Secondary | ICD-10-CM | POA: Diagnosis not present

## 2019-11-12 DIAGNOSIS — Z79899 Other long term (current) drug therapy: Secondary | ICD-10-CM | POA: Insufficient documentation

## 2019-11-12 DIAGNOSIS — Z87891 Personal history of nicotine dependence: Secondary | ICD-10-CM | POA: Diagnosis not present

## 2019-11-12 DIAGNOSIS — G8929 Other chronic pain: Secondary | ICD-10-CM | POA: Insufficient documentation

## 2019-11-12 DIAGNOSIS — E119 Type 2 diabetes mellitus without complications: Secondary | ICD-10-CM | POA: Diagnosis not present

## 2019-11-12 DIAGNOSIS — Z590 Homelessness: Secondary | ICD-10-CM | POA: Diagnosis not present

## 2019-11-12 DIAGNOSIS — M1990 Unspecified osteoarthritis, unspecified site: Secondary | ICD-10-CM | POA: Insufficient documentation

## 2019-11-12 NOTE — ED Triage Notes (Signed)
Pt c/o right hip pain "for a while" worsening when it's cold. Left hand swollen, reports he fell and broke hand, but took his splint off. Abrasion to forehead from prior fall as well.

## 2019-11-13 ENCOUNTER — Encounter (HOSPITAL_COMMUNITY): Payer: Self-pay | Admitting: *Deleted

## 2019-11-13 ENCOUNTER — Emergency Department (HOSPITAL_COMMUNITY): Payer: Medicare HMO

## 2019-11-13 ENCOUNTER — Emergency Department (HOSPITAL_COMMUNITY)
Admission: EM | Admit: 2019-11-13 | Discharge: 2019-11-13 | Disposition: A | Discharge status: Home / Self Care | Payer: Medicare HMO | Source: Home / Self Care | Attending: Emergency Medicine | Admitting: Emergency Medicine

## 2019-11-13 ENCOUNTER — Other Ambulatory Visit: Payer: Self-pay

## 2019-11-13 DIAGNOSIS — R609 Edema, unspecified: Secondary | ICD-10-CM | POA: Insufficient documentation

## 2019-11-13 DIAGNOSIS — E119 Type 2 diabetes mellitus without complications: Secondary | ICD-10-CM | POA: Insufficient documentation

## 2019-11-13 DIAGNOSIS — Z87891 Personal history of nicotine dependence: Secondary | ICD-10-CM | POA: Insufficient documentation

## 2019-11-13 DIAGNOSIS — M7989 Other specified soft tissue disorders: Secondary | ICD-10-CM | POA: Insufficient documentation

## 2019-11-13 DIAGNOSIS — Z590 Homelessness: Secondary | ICD-10-CM | POA: Insufficient documentation

## 2019-11-13 DIAGNOSIS — M25552 Pain in left hip: Secondary | ICD-10-CM | POA: Diagnosis not present

## 2019-11-13 DIAGNOSIS — Z79899 Other long term (current) drug therapy: Secondary | ICD-10-CM | POA: Insufficient documentation

## 2019-11-13 DIAGNOSIS — I10 Essential (primary) hypertension: Secondary | ICD-10-CM | POA: Insufficient documentation

## 2019-11-13 DIAGNOSIS — Z85038 Personal history of other malignant neoplasm of large intestine: Secondary | ICD-10-CM | POA: Insufficient documentation

## 2019-11-13 NOTE — ED Notes (Signed)
Ortho to come place elbow splint.

## 2019-11-13 NOTE — Progress Notes (Signed)
Orthopedic Tech Progress Note Patient Details:  Caleb Andrews 1941/01/04 [id number]  Ortho Devices Type of Ortho Device: Ace wrap, Arm sling, Long arm splint Ortho Device/Splint Interventions: Application   Post Interventions Patient Tolerated: Well Instructions Provided: Care of device   [name] 11/13/2019, 7:59 AM

## 2019-11-13 NOTE — ED Notes (Signed)
Pt provided with discharge instructions and follow up information. Pt given the opportunity to ask questions, patient verbalized understanding.

## 2019-11-13 NOTE — ED Notes (Signed)
Pt to xr 
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
## 2019-11-13 NOTE — ED Notes (Signed)
Sign-pad unavailable upon discharge. Pt provided with discharge instructions and verbalizes understanding of discharge instructions.

## 2019-11-13 NOTE — ED Triage Notes (Signed)
Pt c/o let hand pain, feels like its more swollen

## 2019-11-13 NOTE — Discharge Instructions (Addendum)
You were evaluated in the Emergency Department and after careful evaluation, we did not find any emergent condition requiring admission or further testing in the hospital.  Your exam/testing today is overall reassuring.  The pain in your hip seems to be due to arthritis.  You also have some swelling in your hand related to your recent broken bone in your arm and this hand needs to be elevated as often as you can during the day.  Please return to the Emergency Department if you experience any worsening of your condition.  We encourage you to follow up with a primary care provider.  Thank you for allowing [place] to be a part of your care.

## 2019-11-13 NOTE — ED Notes (Signed)
Pt given list of local shelters to take with him.

## 2019-11-13 NOTE — Discharge Instructions (Signed)
Thank you for allowing me to care for you today in the Emergency Department.   Keep your splint in place.  Follow-up as directed.  Try to keep your left arm elevated so that your left hand is above your heart to help with the swelling.  Return to the emergency department if your entire forearm and hand get swollen and tight, if your fingertips turn blue, if you develop new numbness, or other new, concerning symptoms.
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
## 2019-11-13 NOTE — ED Notes (Signed)
Pt reporting he has no where to go due to being homeless and would like to speak with a case worker. This RN called social work and they reported that there are limited resources on the weekend. Stated they will email this RN a list of shelters in the area.

## 2019-11-14 ENCOUNTER — Emergency Department (HOSPITAL_COMMUNITY): Payer: Medicare HMO

## 2019-11-14 ENCOUNTER — Encounter (HOSPITAL_COMMUNITY): Payer: Self-pay | Admitting: Emergency Medicine

## 2019-11-14 ENCOUNTER — Other Ambulatory Visit: Payer: Self-pay

## 2019-11-14 ENCOUNTER — Emergency Department (HOSPITAL_COMMUNITY)
Admission: EM | Admit: 2019-11-14 | Discharge: 2019-11-14 | Disposition: A | Discharge status: Home / Self Care | Payer: Medicare HMO | Attending: Emergency Medicine | Admitting: Emergency Medicine

## 2019-11-14 DIAGNOSIS — E119 Type 2 diabetes mellitus without complications: Secondary | ICD-10-CM | POA: Insufficient documentation

## 2019-11-14 DIAGNOSIS — Z87891 Personal history of nicotine dependence: Secondary | ICD-10-CM | POA: Diagnosis not present

## 2019-11-14 DIAGNOSIS — Z85038 Personal history of other malignant neoplasm of large intestine: Secondary | ICD-10-CM | POA: Diagnosis not present

## 2019-11-14 DIAGNOSIS — M25551 Pain in right hip: Secondary | ICD-10-CM | POA: Insufficient documentation

## 2019-11-14 DIAGNOSIS — Z79899 Other long term (current) drug therapy: Secondary | ICD-10-CM | POA: Insufficient documentation

## 2019-11-14 DIAGNOSIS — Z5900 Homelessness unspecified: Secondary | ICD-10-CM

## 2019-11-14 DIAGNOSIS — I10 Essential (primary) hypertension: Secondary | ICD-10-CM | POA: Insufficient documentation

## 2019-11-14 DIAGNOSIS — G8929 Other chronic pain: Secondary | ICD-10-CM | POA: Insufficient documentation

## 2019-11-14 MED ORDER — KETOROLAC TROMETHAMINE 60 MG/2ML IM SOLN
60.0000 mg | Freq: Once | INTRAMUSCULAR | Status: AC
  Administered 2019-11-14: 60 mg via INTRAMUSCULAR
  Filled 2019-11-14: qty 2

## 2019-11-14 MED ORDER — CYCLOBENZAPRINE HCL 10 MG PO TABS
5.0000 mg | ORAL_TABLET | Freq: Once | ORAL | Status: AC
  Administered 2019-11-14: 5 mg via ORAL
  Filled 2019-11-14: qty 1
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
## 2019-11-15 ENCOUNTER — Encounter (HOSPITAL_COMMUNITY): Payer: Self-pay | Admitting: Emergency Medicine

## 2019-11-15 ENCOUNTER — Emergency Department (HOSPITAL_COMMUNITY)
Admission: EM | Admit: 2019-11-15 | Discharge: 2019-11-15 | Disposition: A | Discharge status: Home / Self Care | Payer: Medicare HMO | Attending: Emergency Medicine | Admitting: Emergency Medicine

## 2019-11-15 ENCOUNTER — Other Ambulatory Visit: Payer: Self-pay

## 2019-11-15 DIAGNOSIS — I10 Essential (primary) hypertension: Secondary | ICD-10-CM | POA: Diagnosis not present

## 2019-11-15 DIAGNOSIS — Z87891 Personal history of nicotine dependence: Secondary | ICD-10-CM | POA: Insufficient documentation

## 2019-11-15 DIAGNOSIS — Z85038 Personal history of other malignant neoplasm of large intestine: Secondary | ICD-10-CM | POA: Insufficient documentation

## 2019-11-15 DIAGNOSIS — M7918 Myalgia, other site: Secondary | ICD-10-CM | POA: Diagnosis present

## 2019-11-15 DIAGNOSIS — Z590 Homelessness: Secondary | ICD-10-CM | POA: Diagnosis not present

## 2019-11-15 DIAGNOSIS — G894 Chronic pain syndrome: Secondary | ICD-10-CM | POA: Diagnosis not present

## 2019-11-15 MED ORDER — KETOROLAC TROMETHAMINE 60 MG/2ML IM SOLN
30.0000 mg | Freq: Once | INTRAMUSCULAR | Status: AC
  Administered 2019-11-15: 30 mg via INTRAMUSCULAR
  Filled 2019-11-15: qty 2
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
## 2019-11-15 NOTE — Discharge Instructions (Signed)
Thank you for allowing me to care for you today. Please return to the emergency department if you have new or worsening symptoms. Take your medications as instructed.  ° °

## 2019-11-15 NOTE — ED Triage Notes (Signed)
Pt states he is homeless and when it gets cold, he gets pain in his hips and knees.

## 2019-11-17 ENCOUNTER — Emergency Department (HOSPITAL_COMMUNITY)
Admission: EM | Admit: 2019-11-17 | Discharge: 2019-11-17 | Disposition: A | Discharge status: Home / Self Care | Payer: Medicare HMO | Attending: Emergency Medicine | Admitting: Emergency Medicine

## 2019-11-17 ENCOUNTER — Other Ambulatory Visit: Payer: Self-pay

## 2019-11-17 ENCOUNTER — Encounter: Payer: Self-pay | Admitting: *Deleted

## 2019-11-17 DIAGNOSIS — I10 Essential (primary) hypertension: Secondary | ICD-10-CM | POA: Insufficient documentation

## 2019-11-17 DIAGNOSIS — Z85038 Personal history of other malignant neoplasm of large intestine: Secondary | ICD-10-CM | POA: Insufficient documentation

## 2019-11-17 DIAGNOSIS — R519 Headache, unspecified: Secondary | ICD-10-CM | POA: Insufficient documentation

## 2019-11-17 DIAGNOSIS — Z590 Homelessness: Secondary | ICD-10-CM | POA: Diagnosis not present

## 2019-11-17 DIAGNOSIS — Z87891 Personal history of nicotine dependence: Secondary | ICD-10-CM | POA: Diagnosis not present

## 2019-11-17 DIAGNOSIS — E119 Type 2 diabetes mellitus without complications: Secondary | ICD-10-CM | POA: Insufficient documentation

## 2019-11-17 LAB — CBG MONITORING, ED: Glucose-Capillary: 92 mg/dL (ref 70–99)

## 2019-11-17 MED ORDER — HYDROCHLOROTHIAZIDE 12.5 MG PO TABS: 12.5000 mg | ORAL_TABLET | Freq: Every day | ORAL | 0 refills | Status: DC

## 2019-11-17 MED ORDER — LISINOPRIL 40 MG PO TABS: 40.0000 mg | ORAL_TABLET | Freq: Every day | ORAL | 0 refills | Status: DC

## 2019-11-17 MED ORDER — HYDROCHLOROTHIAZIDE 12.5 MG PO CAPS
12.5000 mg | ORAL_CAPSULE | Freq: Once | ORAL | Status: AC
  Administered 2019-11-17: 18:00:00 12.5 mg via ORAL
  Filled 2019-11-17: qty 1

## 2019-11-17 MED ORDER — LISINOPRIL 20 MG PO TABS
40.0000 mg | ORAL_TABLET | Freq: Once | ORAL | Status: AC
  Administered 2019-11-17: 40 mg via ORAL
  Filled 2019-11-17: qty 2

## 2019-11-17 NOTE — ED Notes (Signed)
Social work saw patient and assisted with medications.
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
## 2019-11-17 NOTE — Discharge Instructions (Signed)
Go to [facility] tomorrow morning at 10 am.   Take your blood pressure medicines   Return to ER if you have worse headaches, chest pain, trouble breathing.
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
## 2019-11-17 NOTE — ED Notes (Signed)
Patient eating sandwich.  No complaints voiced

## 2019-11-17 NOTE — Care Management (Signed)
  [place] Medication Assistance Card Name: Caleb Andrews ID (MRN): [id number] [place]: [id number] RX Group: [id number] Discharge Date: 11/17/2019 Expiration Date: 11/27/2019                                           (must be filled within 7 days of discharge   Dear   : Caleb Andrews  You have been approved to have the prescriptions written by your discharging physician filled through our [facility] (Medication Assistance Through [facility]) program. This program allows for a one-time (no refills) 34-day supply of selected medications for a low copay amount.  The copay is $3.00 per prescription. For instance, if you have one prescription, you will pay $3.00; for two prescriptions, you pay $6.00; for three prescriptions, you pay $9.00; and so on.  Only certain pharmacies are participating in this program with [facility]. You will need to select one of the pharmacies from the attached list and take your prescriptions, this letter, and your photo ID to one of the participating pharmacies.   We are excited that you are able to use the [facility] program to get your medications. These prescriptions must be filled within 7 days of hospital discharge or they will no longer be valid for the [facility] program. Should you have any problems with your prescriptions please contact your case management team member at [phone] for Caleb Andrews/[place]/ [place] you, [place] [employer]

## 2019-11-17 NOTE — Social Work (Addendum)
TOC team reached out to Director of Vulnerable Populations in an effort to link Pt with more resources.  After consult with Director of Vulnerable Resources, [facility] team has reached out to APS to make report on behalf of Pt.  Awaiting response.  APS case worker returned call.  Report already made from [facility] this morning. Case manager will meet Pt at [facility] on 2/11 at 10 am.

## 2019-11-17 NOTE — ED Triage Notes (Signed)
Pt here from a motel for c/o hypertension. Also states he is homeless and someone is working on trying to get him into a shelter.

## 2019-12-03 ENCOUNTER — Other Ambulatory Visit: Payer: Self-pay | Admitting: *Deleted

## 2019-12-03 DIAGNOSIS — Z20822 Contact with and (suspected) exposure to covid-19: Secondary | ICD-10-CM

## 2019-12-04 LAB — NOVEL CORONAVIRUS, NAA: SARS-CoV-2, NAA: NOT DETECTED

## 2019-12-11 ENCOUNTER — Emergency Department (HOSPITAL_COMMUNITY)
Admission: EM | Admit: 2019-12-11 | Discharge: 2019-12-12 | Disposition: A | Discharge status: Home / Self Care | Payer: Medicare HMO | Source: Home / Self Care | Attending: Emergency Medicine | Admitting: Emergency Medicine

## 2019-12-11 ENCOUNTER — Other Ambulatory Visit: Payer: Self-pay

## 2019-12-11 ENCOUNTER — Encounter (HOSPITAL_COMMUNITY): Payer: Self-pay | Admitting: *Deleted

## 2019-12-11 ENCOUNTER — Emergency Department (HOSPITAL_COMMUNITY)
Admission: EM | Admit: 2019-12-11 | Discharge: 2019-12-11 | Disposition: A | Discharge status: Home / Self Care | Payer: Medicare HMO | Attending: Emergency Medicine | Admitting: Emergency Medicine

## 2019-12-11 DIAGNOSIS — E119 Type 2 diabetes mellitus without complications: Secondary | ICD-10-CM | POA: Insufficient documentation

## 2019-12-11 DIAGNOSIS — G8929 Other chronic pain: Secondary | ICD-10-CM

## 2019-12-11 DIAGNOSIS — M1990 Unspecified osteoarthritis, unspecified site: Secondary | ICD-10-CM | POA: Insufficient documentation

## 2019-12-11 DIAGNOSIS — M25552 Pain in left hip: Secondary | ICD-10-CM | POA: Insufficient documentation

## 2019-12-11 DIAGNOSIS — Z79899 Other long term (current) drug therapy: Secondary | ICD-10-CM | POA: Insufficient documentation

## 2019-12-11 DIAGNOSIS — I10 Essential (primary) hypertension: Secondary | ICD-10-CM | POA: Insufficient documentation

## 2019-12-11 DIAGNOSIS — Z85038 Personal history of other malignant neoplasm of large intestine: Secondary | ICD-10-CM | POA: Insufficient documentation

## 2019-12-11 DIAGNOSIS — E1140 Type 2 diabetes mellitus with diabetic neuropathy, unspecified: Secondary | ICD-10-CM | POA: Insufficient documentation

## 2019-12-11 DIAGNOSIS — Z590 Homelessness: Secondary | ICD-10-CM | POA: Insufficient documentation

## 2019-12-11 DIAGNOSIS — Z87891 Personal history of nicotine dependence: Secondary | ICD-10-CM | POA: Insufficient documentation

## 2019-12-11 MED ORDER — CYCLOBENZAPRINE HCL 10 MG PO TABS
5.0000 mg | ORAL_TABLET | Freq: Once | ORAL | Status: AC
  Administered 2019-12-11: 5 mg via ORAL
  Filled 2019-12-11: qty 1

## 2019-12-11 MED ORDER — KETOROLAC TROMETHAMINE 60 MG/2ML IM SOLN
30.0000 mg | Freq: Once | INTRAMUSCULAR | Status: AC
  Administered 2019-12-11: 30 mg via INTRAMUSCULAR
  Filled 2019-12-11: qty 2
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
## 2019-12-11 NOTE — ED Triage Notes (Signed)
BIB EMS reporting bilateral hip pain. Homeless.

## 2019-12-11 NOTE — ED Triage Notes (Addendum)
Pt picked up from bus stop by ems, c/o chronic hip pain. 162/98, 82, 16, 98, 97.2. cbg 147

## 2019-12-11 NOTE — ED Notes (Signed)
Pt ambulating in hall. Steady gait.

## 2019-12-12 NOTE — ED Triage Notes (Signed)
Pt reports his hip hurts, does admit to ETOH tonight.
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
## 2019-12-12 NOTE — ED Notes (Signed)
Patient verbalizes understanding of discharge instructions. Opportunity for questioning and answers were provided. Armband removed by staff, pt discharged from ED. Pt. ambulatory and discharged home.  

## 2019-12-12 NOTE — Discharge Instructions (Addendum)
Please follow up with your doctor for management of chronic pain

## 2019-12-17 ENCOUNTER — Emergency Department (HOSPITAL_COMMUNITY)
Admission: EM | Admit: 2019-12-17 | Discharge: 2019-12-17 | Disposition: A | Discharge status: Home / Self Care | Payer: Medicare HMO | Attending: Emergency Medicine | Admitting: Emergency Medicine

## 2019-12-17 ENCOUNTER — Other Ambulatory Visit: Payer: Self-pay

## 2019-12-17 ENCOUNTER — Encounter (HOSPITAL_COMMUNITY): Payer: Self-pay | Admitting: Emergency Medicine

## 2019-12-17 ENCOUNTER — Telehealth: Payer: Self-pay | Admitting: *Deleted

## 2019-12-17 ENCOUNTER — Encounter: Payer: Self-pay | Admitting: *Deleted

## 2019-12-17 DIAGNOSIS — Z590 Homelessness: Secondary | ICD-10-CM | POA: Diagnosis not present

## 2019-12-17 DIAGNOSIS — R1084 Generalized abdominal pain: Secondary | ICD-10-CM

## 2019-12-17 DIAGNOSIS — E119 Type 2 diabetes mellitus without complications: Secondary | ICD-10-CM | POA: Diagnosis not present

## 2019-12-17 DIAGNOSIS — I10 Essential (primary) hypertension: Secondary | ICD-10-CM | POA: Diagnosis not present

## 2019-12-17 DIAGNOSIS — Z7984 Long term (current) use of oral hypoglycemic drugs: Secondary | ICD-10-CM | POA: Insufficient documentation
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
## 2019-12-17 NOTE — Discharge Instructions (Signed)
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
## 2019-12-17 NOTE — ED Notes (Signed)
Patient verbalizes understanding of discharge instructions. Opportunity for questioning and answers were provided. Armband removed by staff. Patient discharged from ED. Signature pad unavailable.  

## 2019-12-17 NOTE — ED Notes (Signed)
Pt endorses feeling "much better," says he needs food and water and feels good to go home. This RN gave water, told him provider will still need to assess him.

## 2019-12-17 NOTE — ED Triage Notes (Signed)
Pt arrives via EMS from [facility] with reports of not feeling well for a month. Seen here for same last week.

## 2019-12-17 NOTE — Telephone Encounter (Signed)
[facility] to get an appointment for March 19th 0820.

## 2020-06-08 ENCOUNTER — Emergency Department (HOSPITAL_COMMUNITY)
Admission: EM | Admit: 2020-06-08 | Discharge: 2020-06-08 | Disposition: A | Discharge status: Home / Self Care | Payer: Medicare HMO | Attending: Emergency Medicine | Admitting: Emergency Medicine

## 2020-06-08 ENCOUNTER — Encounter (HOSPITAL_COMMUNITY): Payer: Self-pay | Admitting: Emergency Medicine

## 2020-06-08 DIAGNOSIS — Z5321 Procedure and treatment not carried out due to patient leaving prior to being seen by health care provider: Secondary | ICD-10-CM | POA: Diagnosis not present

## 2020-06-08 DIAGNOSIS — F10129 Alcohol abuse with intoxication, unspecified: Secondary | ICD-10-CM | POA: Insufficient documentation

## 2020-06-08 NOTE — ED Notes (Signed)
Patient had screeners call ride home

## 2020-06-08 NOTE — ED Notes (Signed)
Patient out in lobby yelling and cursing. Security moved patient to different area of lobby to not disturb other patients
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
## 2020-06-19 ENCOUNTER — Other Ambulatory Visit: Payer: Self-pay

## 2020-06-19 ENCOUNTER — Encounter (HOSPITAL_COMMUNITY): Payer: Self-pay | Admitting: Emergency Medicine

## 2020-06-19 DIAGNOSIS — Z5321 Procedure and treatment not carried out due to patient leaving prior to being seen by health care provider: Secondary | ICD-10-CM | POA: Insufficient documentation

## 2020-06-19 DIAGNOSIS — F10129 Alcohol abuse with intoxication, unspecified: Secondary | ICD-10-CM | POA: Insufficient documentation
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
## 2020-06-20 ENCOUNTER — Emergency Department (HOSPITAL_COMMUNITY)
Admission: EM | Admit: 2020-06-20 | Discharge: 2020-06-20 | Disposition: A | Discharge status: Home / Self Care | Payer: Medicare HMO | Attending: Emergency Medicine | Admitting: Emergency Medicine

## 2020-06-20 NOTE — ED Notes (Signed)
Pt eloped from waiting area. Called [facility].  

## 2020-07-27 ENCOUNTER — Emergency Department (HOSPITAL_COMMUNITY)
Admission: EM | Admit: 2020-07-27 | Discharge: 2020-07-27 | Disposition: A | Discharge status: Home / Self Care | Payer: Medicare HMO | Attending: Emergency Medicine | Admitting: Emergency Medicine

## 2020-07-27 ENCOUNTER — Other Ambulatory Visit: Payer: Self-pay

## 2020-07-27 ENCOUNTER — Encounter (HOSPITAL_COMMUNITY): Payer: Self-pay | Admitting: Emergency Medicine

## 2020-07-27 DIAGNOSIS — E119 Type 2 diabetes mellitus without complications: Secondary | ICD-10-CM | POA: Insufficient documentation

## 2020-07-27 DIAGNOSIS — Y906 Blood alcohol level of 120-199 mg/100 ml: Secondary | ICD-10-CM | POA: Insufficient documentation

## 2020-07-27 DIAGNOSIS — F10929 Alcohol use, unspecified with intoxication, unspecified: Secondary | ICD-10-CM

## 2020-07-27 DIAGNOSIS — I10 Essential (primary) hypertension: Secondary | ICD-10-CM | POA: Diagnosis not present

## 2020-07-27 DIAGNOSIS — F10120 Alcohol abuse with intoxication, uncomplicated: Secondary | ICD-10-CM | POA: Diagnosis not present

## 2020-07-27 DIAGNOSIS — Z87891 Personal history of nicotine dependence: Secondary | ICD-10-CM | POA: Insufficient documentation

## 2020-07-27 DIAGNOSIS — Z79899 Other long term (current) drug therapy: Secondary | ICD-10-CM | POA: Insufficient documentation

## 2020-07-27 DIAGNOSIS — R4182 Altered mental status, unspecified: Secondary | ICD-10-CM | POA: Diagnosis present

## 2020-07-27 LAB — COMPREHENSIVE METABOLIC PANEL
ALT: 24 U/L (ref 0–44)
AST: 29 U/L (ref 15–41)
Albumin: 3.1 g/dL — ABNORMAL LOW (ref 3.5–5.0)
Alkaline Phosphatase: 92 U/L (ref 38–126)
Anion gap: 10 (ref 5–15)
BUN: 16 mg/dL (ref 8–23)
CO2: 26 mmol/L (ref 22–32)
Calcium: 9 mg/dL (ref 8.9–10.3)
Chloride: 102 mmol/L (ref 98–111)
Creatinine, Ser: 1.52 mg/dL — ABNORMAL HIGH (ref 0.61–1.24)
GFR, Estimated: 47 mL/min — ABNORMAL LOW (ref >60)
Glucose, Bld: 125 mg/dL — ABNORMAL HIGH (ref 70–99)
Potassium: 4 mmol/L (ref 3.5–5.1)
Sodium: 138 mmol/L (ref 135–145)
Total Bilirubin: 0.6 mg/dL (ref 0.3–1.2)
Total Protein: 6.7 g/dL (ref 6.5–8.1)

## 2020-07-27 LAB — CBC WITH DIFFERENTIAL/PLATELET
Abs Immature Granulocytes: 0.02 10*3/uL (ref 0.00–0.07)
Basophils Absolute: 0.1 10*3/uL (ref 0.0–0.1)
Basophils Relative: 2 %
Eosinophils Absolute: 0.2 10*3/uL (ref 0.0–0.5)
Eosinophils Relative: 4 %
HCT: 45.4 % (ref 39.0–52.0)
Hemoglobin: 14.5 g/dL (ref 13.0–17.0)
Immature Granulocytes: 0 %
Lymphocytes Relative: 32 %
Lymphs Abs: 1.5 10*3/uL (ref 0.7–4.0)
MCH: 29.8 pg (ref 26.0–34.0)
MCHC: 31.9 g/dL (ref 30.0–36.0)
MCV: 93.2 fL (ref 80.0–100.0)
Monocytes Absolute: 0.7 10*3/uL (ref 0.1–1.0)
Monocytes Relative: 14 %
Neutro Abs: 2.3 10*3/uL (ref 1.7–7.7)
Neutrophils Relative %: 48 %
Platelets: 257 10*3/uL (ref 150–400)
RBC: 4.87 MIL/uL (ref 4.22–5.81)
RDW: 15.1 % (ref 11.5–15.5)
WBC: 4.8 10*3/uL (ref 4.0–10.5)
nRBC: 0 % (ref 0.0–0.2)

## 2020-07-27 LAB — LIPASE, BLOOD: Lipase: 27 U/L (ref 11–51)

## 2020-07-27 LAB — ETHANOL: Alcohol, Ethyl (B): 124 mg/dL — ABNORMAL HIGH (ref <10)

## 2020-07-27 NOTE — Progress Notes (Signed)
CSW provided sweatpants and a sweatshirt from [facility] closet to Pt to replace soiled clothing.

## 2020-07-27 NOTE — ED Triage Notes (Signed)
Pt to ED via [facility] after being unresponsive at bus stop.  On fire arrival pt was apneic, narcan x's 2 IN was given without response.  On EMS arrival pt was given 0.5 Narcan IV and pt started to arouse.  Pt admits to drinking "a lot" today.  Pt was incontinent of urine and stool on arrival to ED.  EMS gave NS 260ml.  CBG was 109
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
## 2020-08-10 ENCOUNTER — Other Ambulatory Visit: Payer: Self-pay

## 2020-08-10 ENCOUNTER — Emergency Department (HOSPITAL_COMMUNITY)
Admission: EM | Admit: 2020-08-10 | Discharge: 2020-08-14 | Disposition: A | Discharge status: Home / Self Care | Payer: Medicare HMO | Attending: Emergency Medicine | Admitting: Emergency Medicine

## 2020-08-10 DIAGNOSIS — I10 Essential (primary) hypertension: Secondary | ICD-10-CM | POA: Diagnosis not present

## 2020-08-10 DIAGNOSIS — Z79899 Other long term (current) drug therapy: Secondary | ICD-10-CM | POA: Diagnosis not present

## 2020-08-10 DIAGNOSIS — Z5900 Homelessness unspecified: Secondary | ICD-10-CM | POA: Diagnosis not present

## 2020-08-10 DIAGNOSIS — Z20822 Contact with and (suspected) exposure to covid-19: Secondary | ICD-10-CM | POA: Diagnosis not present

## 2020-08-10 DIAGNOSIS — Z87891 Personal history of nicotine dependence: Secondary | ICD-10-CM | POA: Insufficient documentation

## 2020-08-10 DIAGNOSIS — F1010 Alcohol abuse, uncomplicated: Secondary | ICD-10-CM | POA: Insufficient documentation

## 2020-08-10 DIAGNOSIS — E119 Type 2 diabetes mellitus without complications: Secondary | ICD-10-CM | POA: Insufficient documentation

## 2020-08-10 DIAGNOSIS — J00 Acute nasopharyngitis [common cold]: Secondary | ICD-10-CM | POA: Insufficient documentation

## 2020-08-10 DIAGNOSIS — Z85038 Personal history of other malignant neoplasm of large intestine: Secondary | ICD-10-CM | POA: Diagnosis not present

## 2020-08-10 LAB — COMPREHENSIVE METABOLIC PANEL
ALT: 25 U/L (ref 0–44)
AST: 29 U/L (ref 15–41)
Albumin: 3.2 g/dL — ABNORMAL LOW (ref 3.5–5.0)
Alkaline Phosphatase: 117 U/L (ref 38–126)
Anion gap: 14 (ref 5–15)
BUN: 27 mg/dL — ABNORMAL HIGH (ref 8–23)
CO2: 21 mmol/L — ABNORMAL LOW (ref 22–32)
Calcium: 9 mg/dL (ref 8.9–10.3)
Chloride: 99 mmol/L (ref 98–111)
Creatinine, Ser: 1.55 mg/dL — ABNORMAL HIGH (ref 0.61–1.24)
GFR, Estimated: 46 mL/min — ABNORMAL LOW (ref >60)
Glucose, Bld: 136 mg/dL — ABNORMAL HIGH (ref 70–99)
Potassium: 3.7 mmol/L (ref 3.5–5.1)
Sodium: 134 mmol/L — ABNORMAL LOW (ref 135–145)
Total Bilirubin: 0.1 mg/dL — ABNORMAL LOW (ref 0.3–1.2)
Total Protein: 7 g/dL (ref 6.5–8.1)

## 2020-08-10 LAB — CBC WITH DIFFERENTIAL/PLATELET
Abs Immature Granulocytes: 0.01 10*3/uL (ref 0.00–0.07)
Basophils Absolute: 0 10*3/uL (ref 0.0–0.1)
Basophils Relative: 1 %
Eosinophils Absolute: 0 10*3/uL (ref 0.0–0.5)
Eosinophils Relative: 1 %
HCT: 47.5 % (ref 39.0–52.0)
Hemoglobin: 15.6 g/dL (ref 13.0–17.0)
Immature Granulocytes: 0 %
Lymphocytes Relative: 17 %
Lymphs Abs: 0.9 10*3/uL (ref 0.7–4.0)
MCH: 29.5 pg (ref 26.0–34.0)
MCHC: 32.8 g/dL (ref 30.0–36.0)
MCV: 90 fL (ref 80.0–100.0)
Monocytes Absolute: 0.4 10*3/uL (ref 0.1–1.0)
Monocytes Relative: 7 %
Neutro Abs: 3.8 10*3/uL (ref 1.7–7.7)
Neutrophils Relative %: 74 %
Platelets: 280 10*3/uL (ref 150–400)
RBC: 5.28 MIL/uL (ref 4.22–5.81)
RDW: 14.3 % (ref 11.5–15.5)
WBC: 5.2 10*3/uL (ref 4.0–10.5)
nRBC: 0 % (ref 0.0–0.2)

## 2020-08-10 LAB — PROTIME-INR
INR: 1 (ref 0.8–1.2)
Prothrombin Time: 12.4 seconds (ref 11.4–15.2)

## 2020-08-10 LAB — URINALYSIS, ROUTINE W REFLEX MICROSCOPIC
Bacteria, UA: NONE SEEN
Bilirubin Urine: NEGATIVE
Glucose, UA: >=500 mg/dL — AB
Ketones, ur: NEGATIVE mg/dL
Leukocytes,Ua: NEGATIVE
Nitrite: NEGATIVE
Protein, ur: 30 mg/dL — AB
Specific Gravity, Urine: 1.01 (ref 1.005–1.030)
pH: 5 (ref 5.0–8.0)

## 2020-08-10 LAB — AMMONIA: Ammonia: 31 umol/L (ref 9–35)

## 2020-08-10 MED ORDER — THIAMINE HCL 100 MG PO TABS
100.0000 mg | ORAL_TABLET | Freq: Every day | ORAL | Status: DC
  Administered 2020-08-11 – 2020-08-14 (×4): 100 mg via ORAL
  Filled 2020-08-10 (×4): qty 1

## 2020-08-10 MED ORDER — LISINOPRIL 20 MG PO TABS
40.0000 mg | ORAL_TABLET | Freq: Every day | ORAL | Status: DC
  Administered 2020-08-11 – 2020-08-14 (×4): 40 mg via ORAL
  Filled 2020-08-10 (×4): qty 2

## 2020-08-10 MED ORDER — LORAZEPAM 2 MG/ML IJ SOLN: 0.0000 mg | Freq: Four times a day (QID) | INTRAMUSCULAR | Status: AC

## 2020-08-10 MED ORDER — LORAZEPAM 1 MG PO TABS: 0.0000 mg | ORAL_TABLET | Freq: Four times a day (QID) | ORAL | Status: AC

## 2020-08-10 MED ORDER — FAMOTIDINE 20 MG PO TABS
20.0000 mg | ORAL_TABLET | Freq: Two times a day (BID) | ORAL | Status: DC
  Administered 2020-08-11 – 2020-08-14 (×7): 20 mg via ORAL
  Filled 2020-08-10 (×7): qty 1

## 2020-08-10 MED ORDER — THIAMINE HCL 100 MG/ML IJ SOLN: 100.0000 mg | Freq: Every day | INTRAMUSCULAR | Status: DC

## 2020-08-10 MED ORDER — LORAZEPAM 1 MG PO TABS: 0.0000 mg | ORAL_TABLET | Freq: Two times a day (BID) | ORAL | Status: DC

## 2020-08-10 MED ORDER — HYDROCHLOROTHIAZIDE 25 MG PO TABS
12.5000 mg | ORAL_TABLET | Freq: Every day | ORAL | Status: DC
  Administered 2020-08-11 – 2020-08-14 (×4): 12.5 mg via ORAL
  Filled 2020-08-10 (×4): qty 1

## 2020-08-10 MED ORDER — TUBERCULIN PPD 5 UNIT/0.1ML ID SOLN
5.0000 [IU] | INTRADERMAL | Status: AC
  Administered 2020-08-11: 5 [IU] via INTRADERMAL
  Filled 2020-08-10: qty 0.1

## 2020-08-10 MED ORDER — LORAZEPAM 2 MG/ML IJ SOLN: 0.0000 mg | Freq: Two times a day (BID) | INTRAMUSCULAR | Status: DC
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
## 2020-08-10 NOTE — ED Triage Notes (Signed)
Pt at friend's house; wanted 911 called because was cold.  Pt admits to drinking "a little bit" tonight.  Pt with wet clothing.  Pt changed to gown and given warm blankets.

## 2020-08-11 DIAGNOSIS — J00 Acute nasopharyngitis [common cold]: Secondary | ICD-10-CM | POA: Diagnosis not present

## 2020-08-11 LAB — RESPIRATORY PANEL BY RT PCR (FLU A&B, COVID)
Influenza A by PCR: NEGATIVE
Influenza B by PCR: NEGATIVE
SARS Coronavirus 2 by RT PCR: NEGATIVE

## 2020-08-11 LAB — CBG MONITORING, ED: Glucose-Capillary: 100 mg/dL — ABNORMAL HIGH (ref 70–99)

## 2020-08-11 NOTE — ED Notes (Signed)
[place] sandwich and soda given to patient

## 2020-08-11 NOTE — ED Notes (Signed)
  Patient given graham crackers and sprite for a snack.  Patient resting comfortably.

## 2020-08-11 NOTE — ED Notes (Signed)
PA at bedside.

## 2020-08-11 NOTE — ED Notes (Signed)
Lunch Tray Ordered @ 1021. 

## 2020-08-11 NOTE — Progress Notes (Addendum)
2pm: CSW spoke with Vicente Males at [facility] who states there are no beds available at the shelter for the patient today, the next time for intake is Monday morning.  The [facility] shelter does not open for the season until 09/06/2020.  12pm: CSW received return call from [place] at [facility] who states he spoke with [facility] staff who are attempting to make a space for the patient at this time due to his age and the weather forecast - Vicente Males to return call once finalized information is received.  8:20am: CSW received consult for this patient as he is homeless and is in need of shelter. This patient has [employer] Medicare and Medicaid - patient will not qualify for ALF as Medicaid does not cover ALF placement unless he is willing to forfeit his income to the facility. The patient ambulates independently and has a walker to use if necessary.  According to previous TOC notes, the patient is actively receiving case management services at [facility].   CSW spoke with Aron Baba regarding this patient - Anner Crete provided CSW with CM at [facility]'s contact information [phone].  CSW spoke with Vicente Males who states the patient was reluctant to sign his check over for ALF placement in the past. Vicente Males is communicating with administration at [facility] to determine their bed availability - Vicente Males to return call.   Madilyn Fireman, MSW, LCSW-A Transitions of Care  Clinical Social Worker  [facility] Emergency Departments  Medical ICU [phone]

## 2020-08-11 NOTE — ED Notes (Signed)
Pt calm watching tv.

## 2020-08-11 NOTE — ED Notes (Signed)
Pt moved to Room 48; report given to [place], [place].

## 2020-08-11 NOTE — ED Provider Notes (Signed)
Emergency Medicine Observation Re-evaluation Note  Caleb Andrews is a 79 y.o. male, seen on rounds today.  Pt initially presented to the ED for complaints of Cold Exposure Currently, the patient is comfortable and lying in bed.  Physical Exam  BP (!) 182/108 (BP Location: Left Arm)   Pulse 91   Temp 97.7 F (36.5 C) (Oral)   Resp 16   Ht 6' (1.829 m)   Wt 68 kg   SpO2 99%   BMI 20.33 kg/m  Physical Exam General: Sitting upright speaking clearly and coherently Cardiac: RRR Lungs: Speaking in clear and coherent sentences.  No respiratory distress.  No stridor. Psych: Normal mood.  Appropriate behavior.  Pleasant to converse with.  ED Course / MDM  EKG:EKG Interpretation  Date/Time:  Thursday August 10 2020 22:18:28 EDT Ventricular Rate:  91 PR Interval:    QRS Duration: 74 QT Interval:  361 QTC Calculation: 445 R Axis:   49 Text Interpretation: Sinus rhythm LAE, consider biatrial enlargement RSR' in V1 or V2, probably normal variant Nonspecific T abnrm, anterolateral leads ST elevation, consider inferior injury Artifact in lead(s) I II III aVR aVL aVF V1 V3 V4 V5 No significant change since last tracing Confirmed by Isla Pence [phone]) on 08/10/2020 10:25:36 PM Also confirmed by Isla Pence [phone]), editor [place], [place] [phone])  on 08/11/2020 8:22:06 AM    I have reviewed the labs performed to date as well as medications administered while in observation.  Recent changes in the last 24 hours include: pt given warm clothes and states he is comfortable in bed.  Plan  Current plan is for CM at [facility]) to reach out to [employer] [facility] to determine bed availability. Patient is not under full IVC at this time.   Rayna Sexton, PA-C 08/11/20 1035    Margette Fast, MD 08/12/20 [phone]

## 2020-08-11 NOTE — ED Notes (Signed)
The message received that the pt would not be moved until Monday  He was dressed and ready to leave   He is now taking a shower and getting clean clothes

## 2020-08-12 DIAGNOSIS — J00 Acute nasopharyngitis [common cold]: Secondary | ICD-10-CM | POA: Diagnosis not present

## 2020-08-12 NOTE — ED Provider Notes (Signed)
Emergency Medicine Observation Re-evaluation Note  Caleb Andrews is a 79 y.o. male, seen on rounds today.  Pt initially presented to the ED for complaints of Cold Exposure Currently, the patient is laying comfortably in bed, able to speak to me normally.  There was no acute overnight event or patient had urinated all over the bed and had a large bowel movement in the sink, room was cleaned and patient was put back in bed, there were no more overnight events, patient appears very cooperative and pleasant.  Physical Exam  BP (!) 148/95 (BP Location: Left Arm)   Pulse 77   Temp 98 F (36.7 C) (Oral)   Resp 18   Ht 6' (1.829 m)   Wt 68 kg   SpO2 100%   BMI 20.33 kg/m  Physical Exam General: Sitting upright speaking clearly Cardiac: Normal rate Lungs: Speaking clearly, no respiratory distress, no stridor. Psych: Normal mood, appears very pleasant.  ED Course / MDM  EKG:EKG Interpretation  Date/Time:  Thursday August 10 2020 22:18:28 EDT Ventricular Rate:  91 PR Interval:    QRS Duration: 74 QT Interval:  361 QTC Calculation: 445 R Axis:   49 Text Interpretation: Sinus rhythm LAE, consider biatrial enlargement RSR' in V1 or V2, probably normal variant Nonspecific T abnrm, anterolateral leads ST elevation, consider inferior injury Artifact in lead(s) I II III aVR aVL aVF V1 V3 V4 V5 No significant change since last tracing Confirmed by Isla Pence [phone]) on 08/10/2020 10:25:36 PM Also confirmed by Isla Pence [phone]), editor [place], [place] [phone])  on 08/11/2020 8:22:06 AM    I have reviewed the labs performed to date as well as medications administered while in observation.  Recent changes in the last 24 hours include none.  Plan  Current plan is for patient to be placed at most likely [place], this most likely will not happen till Monday. Patient is not under full IVC at this time.   Alfredia Client, PA-C 08/12/20 2233    Blanchie Dessert,  MD 08/13/20 [phone]

## 2020-08-12 NOTE — ED Notes (Signed)
Breakfast Ordered 

## 2020-08-12 NOTE — ED Notes (Signed)
Dinner tray ordered.

## 2020-08-12 NOTE — ED Notes (Signed)
Patient came outside room about 0425 stating he was going to the bathroom.  Noticed patient was wet and making a mess getting to the bathroom so I gave him a pair of burgundy scrubs, socks, and asked if he would clean himself up while in the bathroom.  I went to the patients room and he had urinated all over the bed, floor, and had a large bowel movement in the sink.  Spoke with the ED secretary who paged housekeeping to mop the floor.  Room was cleaned and bed changed.  Patient placed back in bed and resting at this time.

## 2020-08-13 DIAGNOSIS — J00 Acute nasopharyngitis [common cold]: Secondary | ICD-10-CM | POA: Diagnosis not present

## 2020-08-13 NOTE — ED Notes (Signed)
Pt swallowed meds with no problem, he is eating cheese & crackers while occupying his time while making beaded bracelets in his room.

## 2020-08-13 NOTE — ED Notes (Signed)
Pt's lunch arrived 

## 2020-08-13 NOTE — ED Notes (Signed)
Pt's lunch ordered 

## 2020-08-14 DIAGNOSIS — J00 Acute nasopharyngitis [common cold]: Secondary | ICD-10-CM | POA: Diagnosis not present

## 2020-08-14 MED ORDER — LISINOPRIL 40 MG PO TABS: 40.0000 mg | ORAL_TABLET | Freq: Every day | ORAL | 0 refills | Status: AC

## 2020-08-14 MED ORDER — HYDROCHLOROTHIAZIDE 12.5 MG PO TABS: 12.5000 mg | ORAL_TABLET | Freq: Every day | ORAL | 0 refills | Status: AC

## 2020-08-14 MED ORDER — FAMOTIDINE 20 MG PO TABS: 20.0000 mg | ORAL_TABLET | Freq: Two times a day (BID) | ORAL | 0 refills | Status: AC

## 2020-08-14 NOTE — ED Notes (Signed)
Diet was ordered for Lunch. 

## 2020-08-14 NOTE — ED Notes (Signed)
Patient had urinated in bed and in floor, EVS came and mop room, clothes and bed linen changed. Patient back in bed sleeping at this time.

## 2020-08-14 NOTE — ED Notes (Signed)
Patient was given Snack and Drink. 

## 2020-08-14 NOTE — ED Provider Notes (Signed)
Emergency Medicine Observation Re-evaluation Note  Caleb Andrews is a 79 y.o. male, seen on rounds today.  Pt initially presented to the ED for complaints of Cold Exposure Currently, the patient is sitting comfortably in bed watching TV.  No overnight events.   Physical Exam  BP (!) 157/99   Pulse 83   Temp 98.3 F (36.8 C) (Oral)   Resp 16   Ht 6' (1.829 m)   Wt 68 kg   SpO2 97%   BMI 20.33 kg/m  CONSTITUTIONAL:  well-appearing, NAD NEURO:  Alert and oriented x 3, no focal deficits EYES:  pupils equal and reactive ENT/NECK:  trachea midline, no JVD CARDIO:  reg rate, reg rhythm, well-perfused PULM:  None labored breathing GI/GU:  Abdomen non-distended MSK/SPINE:  No gross deformities, no edema SKIN:  no rash obvious, atraumatic, no ecchymosis  PSYCH:  Appropriate speech and behavior  ED Course / MDM  EKG:EKG Interpretation  Date/Time:  Thursday August 10 2020 22:18:28 EDT Ventricular Rate:  91 PR Interval:    QRS Duration: 74 QT Interval:  361 QTC Calculation: 445 R Axis:   49 Text Interpretation: Sinus rhythm LAE, consider biatrial enlargement RSR' in V1 or V2, probably normal variant Nonspecific T abnrm, anterolateral leads ST elevation, consider inferior injury Artifact in lead(s) I II III aVR aVL aVF V1 V3 V4 V5 No significant change since last tracing Confirmed by Isla Pence [phone]) on 08/10/2020 10:25:36 PM Also confirmed by Isla Pence [phone]), editor [place], [place]Ver[place] [phone])  on 08/11/2020 8:22:06 AM    I have reviewed the labs performed to date as well as medications administered while in observation.  Recent changes in the last 24 hours include none.  Plan  Current plan is for social placement.  I discussed with social work who has confirmed availability at [facility] and [facility].  Patient will be transferred to one of these via [place].    Patient is not under full IVC at this time.  Patient aware of plan.  Agreeable to discharge and  current ministries versus [employer] se[employer]ologist.  I discharge patient with his antihypertensive medicines refilled for 30 days.  He will follow with PCP for continued primary care needs.   Pati Gallo [place], [place] 08/14/20 1045    Isla Pence, MD 08/14/20 1313

## 2020-08-14 NOTE — ED Notes (Signed)
Up to the br 

## 2020-08-14 NOTE — Discharge Planning (Signed)
RNCM consulted regarding transportation needs for pt.  RNCM presented and explained rider waiver/release of liability form as pt will utilize [facility] transportation services from hospital.  Pt signed waiver agreeing to the written terms.

## 2020-08-14 NOTE — Progress Notes (Addendum)
10am: CSW spoke with [place] at [facility] who states there are beds available. Micheline Rough states patient can arrive at anytime today before 9pm.  CSW spoke with patient who states he is agreeable to discharge to [facility]. Patient reports he is agreeable to pay the program fees ($350-$400 monthly) and is willing to participate in working while he is there.  CSW arranged for transportation to [facility] ([address] [place], [place]) via [employer] for pick up at [facility] [employer].  CSW communicated with Ova Freshwater, PA and Aldona Bar, RN regarding plan.  8am: CSW spoke with receptionist at [facility] - no beds currently available at this time. CSW provided receptionist with contact information to return call once a bed becomes available.  CSW attempted to reach Vicente Males at [facility] without success, no voicemail option available.  CSW attempted to reach staff at [facility] without success, no voicemail option available.  Madilyn Fireman, MSW, LCSW-A Transitions of Care  Clinical Social Worker  [facility] Emergency Departments  Medical ICU [phone]

## 2020-08-14 NOTE — Discharge Instructions (Signed)
I have printed you a 30-day prescription for your prescribed antihypertensive medicines--- for your blood pressure--- please take these as prescribed.  Please follow-up with your primary care doctor.

## 2020-12-22 IMAGING — DX DG HAND COMPLETE 3+V*L*
3 series · 3 of 3 positions shown · non-contrast
Comparison: Wrist radiography 11/04/2019

CLINICAL DATA: Fall.  Posterior swelling.

EXAM:
LEFT HAND - COMPLETE 3+ VIEW

[hand pa]
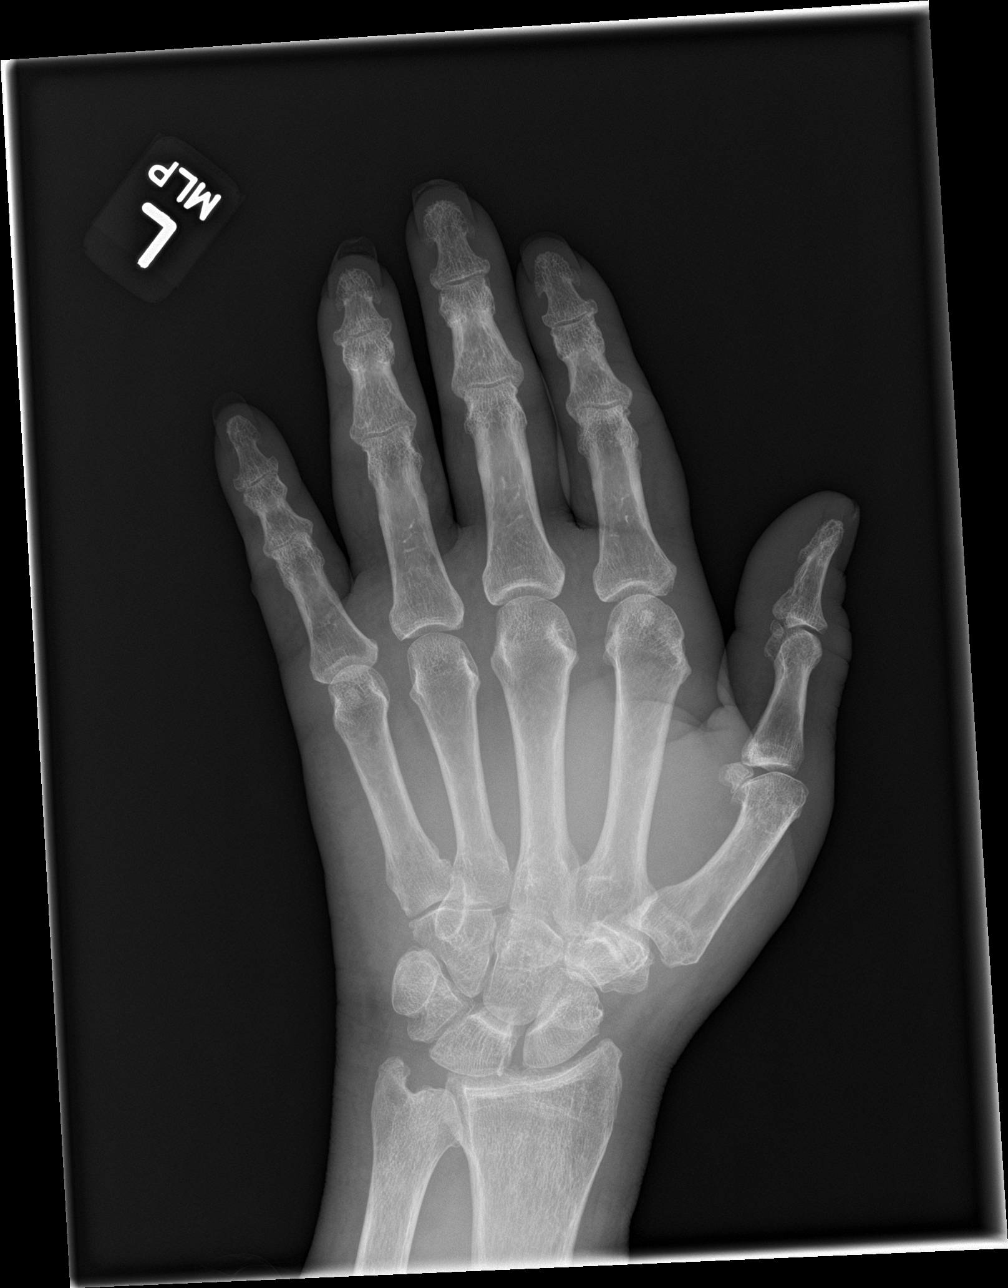

[hand obl]
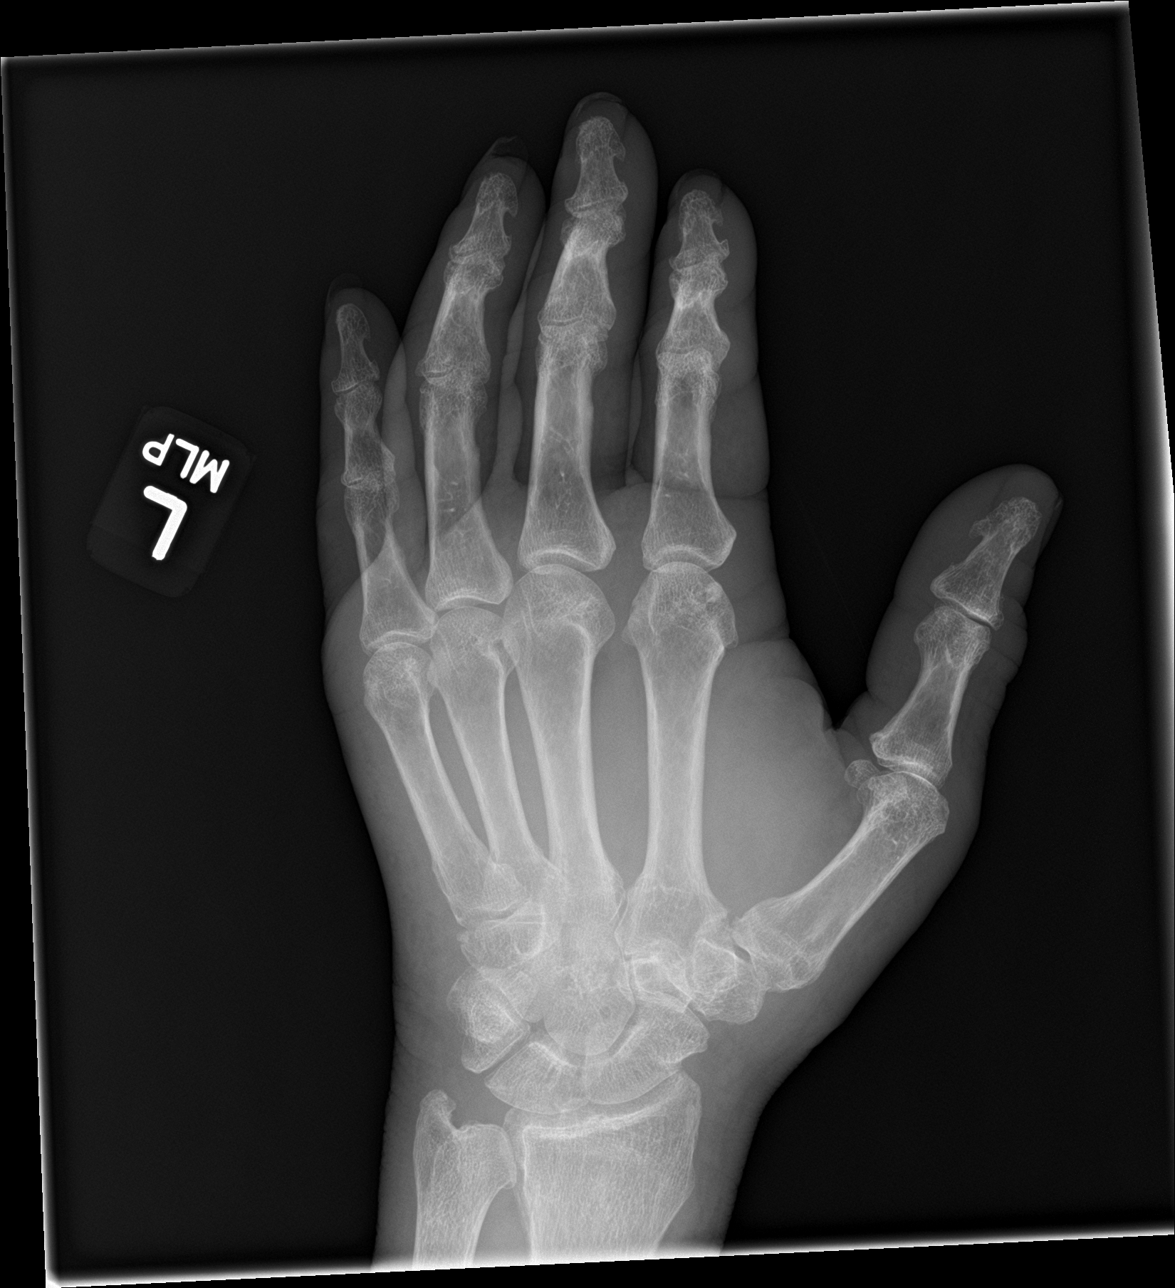

[hand lat]
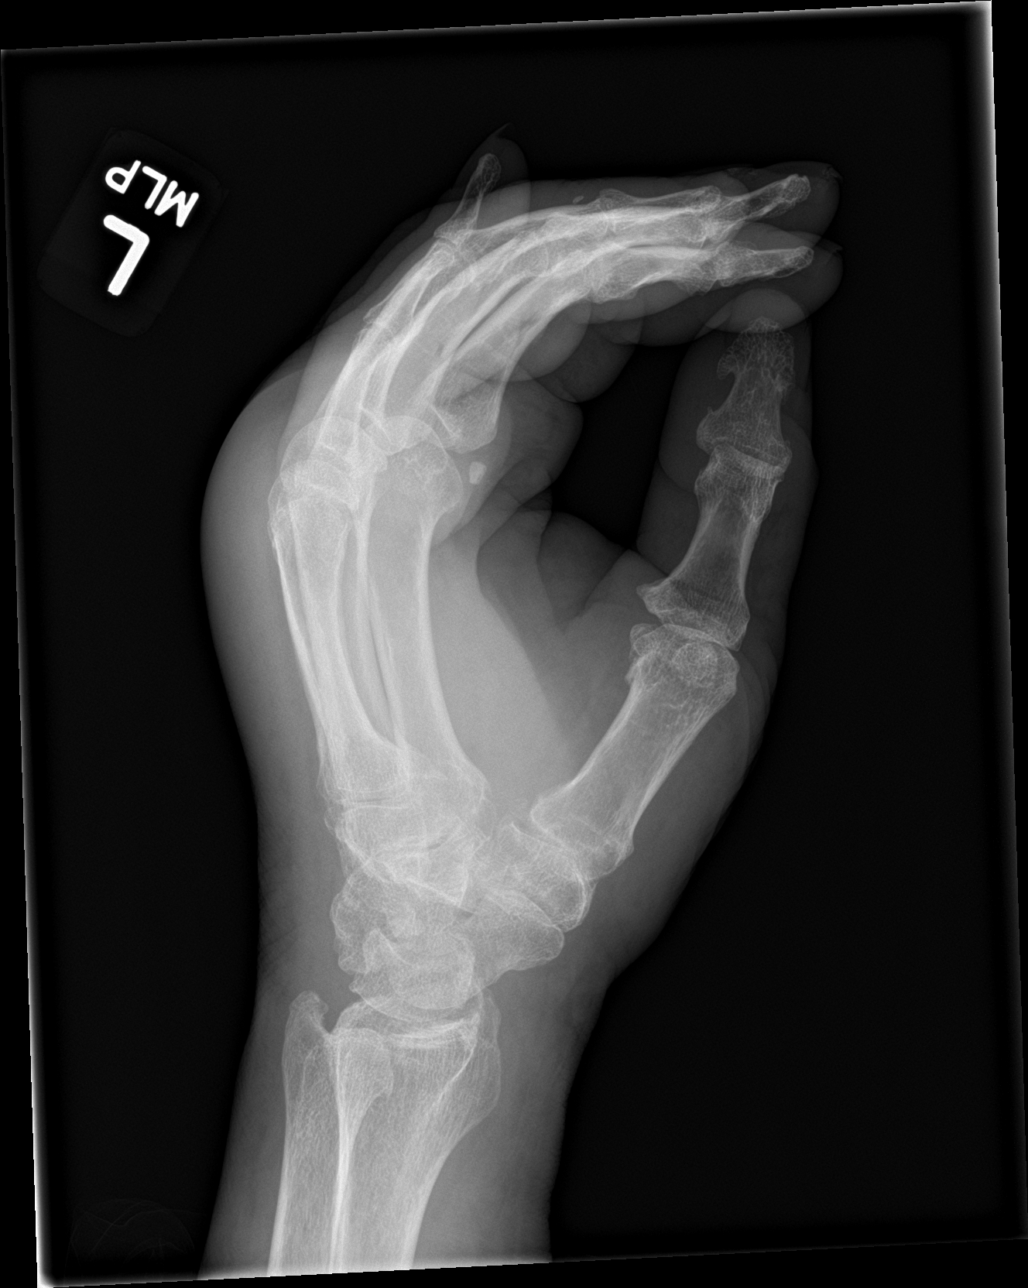

[3 of 3 positions shown; findings below may reference images not displayed]

FINDINGS: Pronounced dorsal soft tissue swelling. No evidence of fracture,
dislocation or focal bone lesion. Mild osteoarthritis of the MCP
joint of the thumb.
IMPRESSION: No acute or traumatic bone finding.  Dorsal soft tissue swelling.

## 2020-12-23 IMAGING — CR DG HIP (WITH OR WITHOUT PELVIS) 2-3V*L*
3 series · 3 of 3 positions shown · non-contrast
Comparison: 09/17/2017

CLINICAL DATA: Left hip pain

EXAM:
DG HIP (WITH OR WITHOUT PELVIS) 2-3V LEFT

[t pelvis ap]
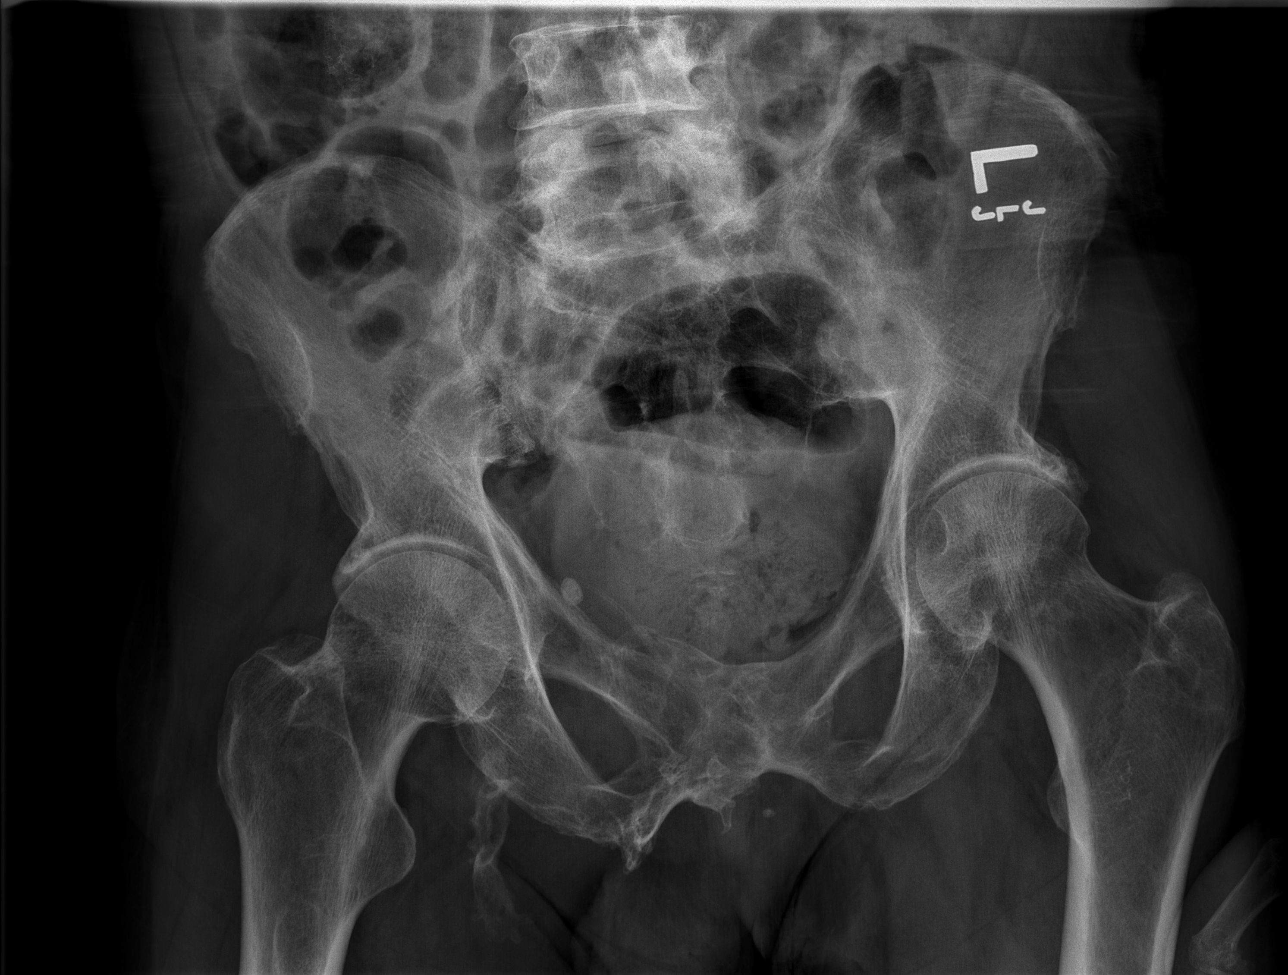

[t hip ap left]
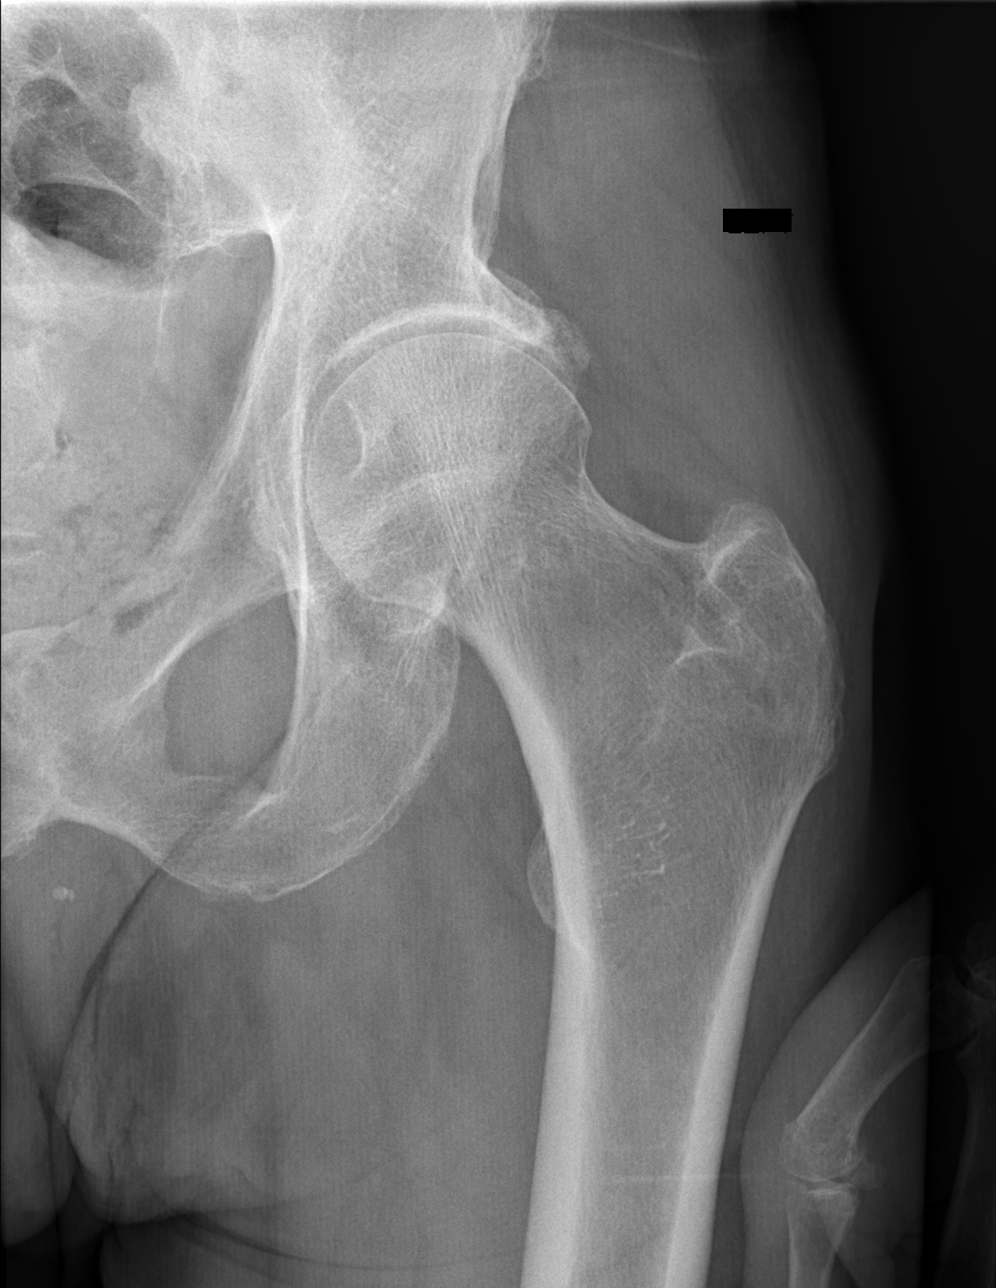

[t hip frog leg left]
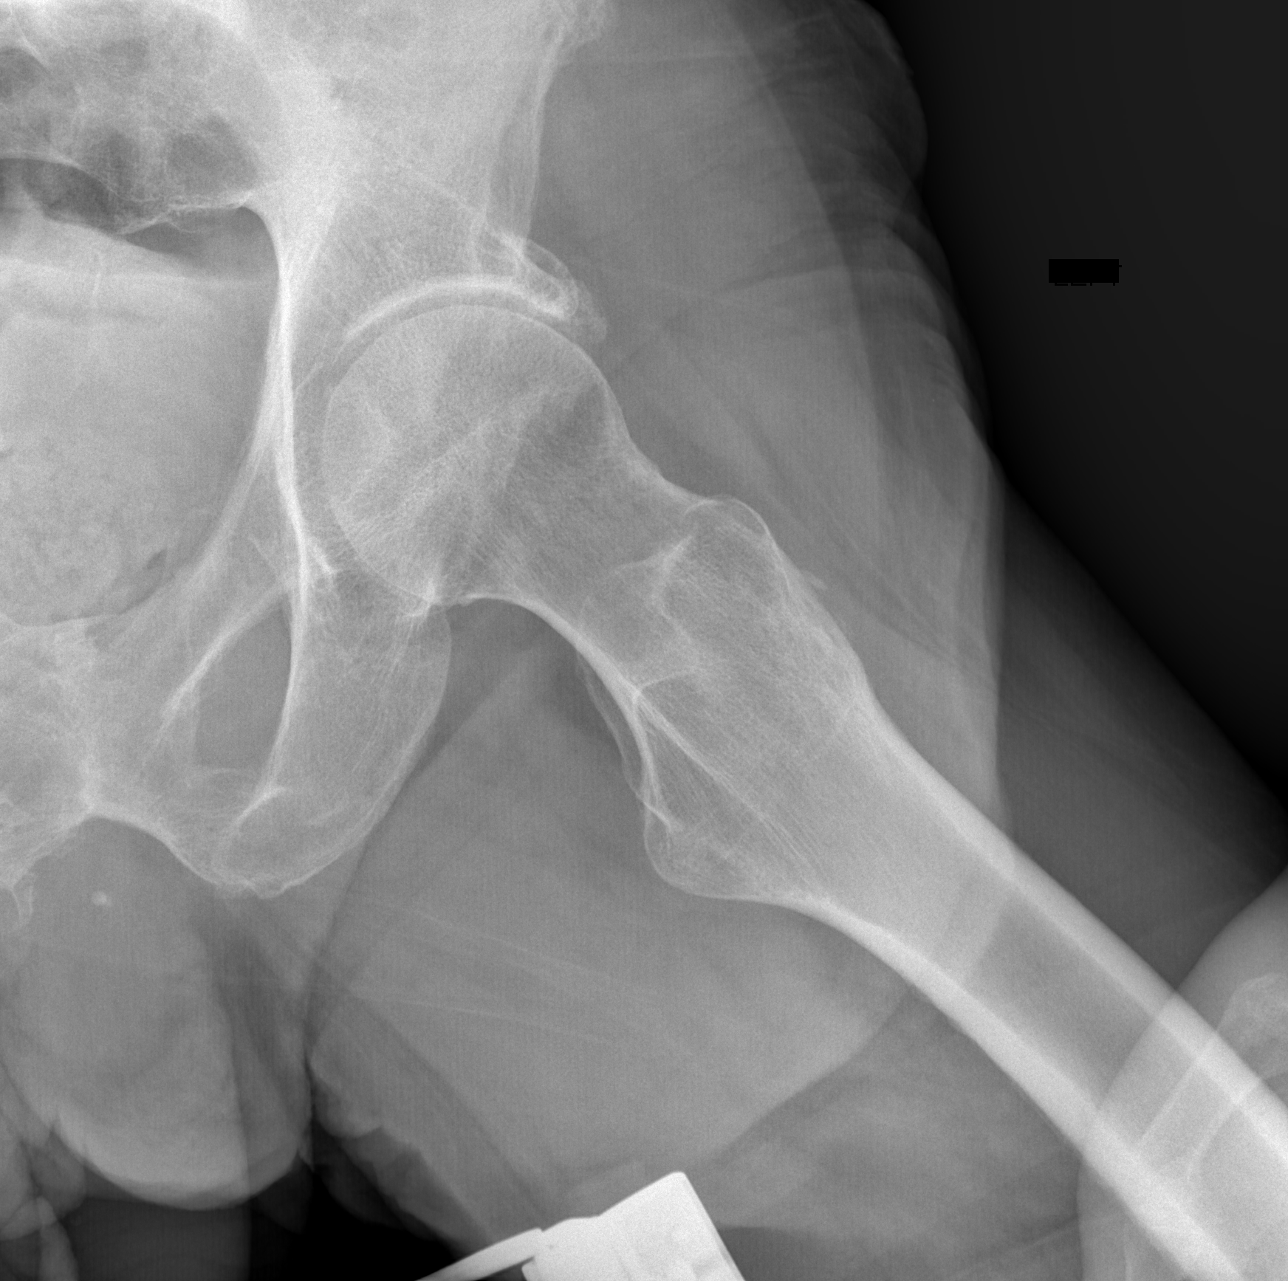

[3 of 3 positions shown; findings below may reference images not displayed]

FINDINGS: Remote pubic body and right obturator ring fractures with symphysis
pubis ankylosis. Heterotopic ossification extends inferiorly from
the right ischial tuberosity region. No evidence of hip fracture or
dislocation. Symmetric superior acetabular spurring.
IMPRESSION: No acute finding.
